# Patient Record
Sex: Female | Born: 1955 | Race: White | Hispanic: No | Smoking: Never smoker
Health system: Southern US, Community
[De-identification: ages and names within clinical notes are randomized; demographics above are authoritative.]

## PROBLEM LIST (undated history)

## (undated) DIAGNOSIS — I10 Essential (primary) hypertension: Secondary | ICD-10-CM

## (undated) DIAGNOSIS — E7800 Pure hypercholesterolemia, unspecified: Secondary | ICD-10-CM

## (undated) DIAGNOSIS — H269 Unspecified cataract: Secondary | ICD-10-CM

## (undated) HISTORY — DX: Essential (primary) hypertension: I10

## (undated) HISTORY — DX: Unspecified cataract: H26.9

## (undated) HISTORY — DX: Pure hypercholesterolemia, unspecified: E78.00

---
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
## 1997-08-14 HISTORY — PX: DILATION AND CURETTAGE OF UTERUS: SHX78

## 1998-02-14 ENCOUNTER — Inpatient Hospital Stay (HOSPITAL_COMMUNITY): Admission: AD | Admit: 1998-02-14 | Discharge: 1998-02-14 | Payer: Self-pay | Admitting: Gynecology

## 1998-02-16 ENCOUNTER — Ambulatory Visit (HOSPITAL_COMMUNITY): Admission: RE | Admit: 1998-02-16 | Discharge: 1998-02-16 | Payer: Self-pay | Admitting: Obstetrics and Gynecology

## 1999-06-01 ENCOUNTER — Other Ambulatory Visit: Admission: RE | Admit: 1999-06-01 | Discharge: 1999-06-01 | Payer: Self-pay | Admitting: Obstetrics and Gynecology

## 2000-07-23 ENCOUNTER — Other Ambulatory Visit: Admission: RE | Admit: 2000-07-23 | Discharge: 2000-07-23 | Payer: Self-pay | Admitting: Gynecology

## 2001-07-31 ENCOUNTER — Other Ambulatory Visit: Admission: RE | Admit: 2001-07-31 | Discharge: 2001-07-31 | Payer: Self-pay | Admitting: Gynecology

## 2002-08-12 ENCOUNTER — Other Ambulatory Visit: Admission: RE | Admit: 2002-08-12 | Discharge: 2002-08-12 | Payer: Self-pay | Admitting: Gynecology

## 2003-08-15 HISTORY — PX: LASIK: SHX215

## 2003-08-27 ENCOUNTER — Other Ambulatory Visit: Admission: RE | Admit: 2003-08-27 | Discharge: 2003-08-27 | Payer: Self-pay | Admitting: Gynecology

## 2005-01-16 ENCOUNTER — Other Ambulatory Visit: Admission: RE | Admit: 2005-01-16 | Discharge: 2005-01-16 | Payer: Self-pay | Admitting: Gynecology

## 2006-03-06 ENCOUNTER — Encounter (INDEPENDENT_AMBULATORY_CARE_PROVIDER_SITE_OTHER): Payer: Self-pay | Admitting: *Deleted

## 2006-03-06 ENCOUNTER — Other Ambulatory Visit: Admission: RE | Admit: 2006-03-06 | Discharge: 2006-03-06 | Payer: Self-pay | Admitting: Gynecology

## 2006-03-06 LAB — CONVERTED CEMR LAB

## 2006-03-14 ENCOUNTER — Ambulatory Visit: Payer: Self-pay | Admitting: Internal Medicine

## 2006-03-19 ENCOUNTER — Ambulatory Visit: Payer: Self-pay | Admitting: Internal Medicine

## 2006-04-05 ENCOUNTER — Ambulatory Visit: Payer: Self-pay | Admitting: Internal Medicine

## 2006-06-11 ENCOUNTER — Ambulatory Visit: Payer: Self-pay | Admitting: Internal Medicine

## 2006-06-11 LAB — CONVERTED CEMR LAB
ALT: 30 units/L (ref 0–40)
AST: 25 units/L (ref 0–37)
HDL: 32.5 mg/dL — ABNORMAL LOW (ref >39.0)
LDL Cholesterol: 79 mg/dL (ref 0–99)
VLDL: 29 mg/dL (ref 0–40)

## 2006-10-10 ENCOUNTER — Ambulatory Visit: Payer: Self-pay | Admitting: Internal Medicine

## 2007-06-05 ENCOUNTER — Other Ambulatory Visit: Admission: RE | Admit: 2007-06-05 | Discharge: 2007-06-05 | Payer: Self-pay | Admitting: Gynecology

## 2007-08-15 HISTORY — PX: COLONOSCOPY: SHX5424

## 2007-11-11 ENCOUNTER — Telehealth (INDEPENDENT_AMBULATORY_CARE_PROVIDER_SITE_OTHER): Payer: Self-pay | Admitting: *Deleted

## 2007-11-14 ENCOUNTER — Encounter (INDEPENDENT_AMBULATORY_CARE_PROVIDER_SITE_OTHER): Payer: Self-pay | Admitting: *Deleted

## 2007-11-14 DIAGNOSIS — Z9089 Acquired absence of other organs: Secondary | ICD-10-CM | POA: Insufficient documentation

## 2007-11-14 DIAGNOSIS — I10 Essential (primary) hypertension: Secondary | ICD-10-CM | POA: Insufficient documentation

## 2007-11-14 DIAGNOSIS — Z9889 Other specified postprocedural states: Secondary | ICD-10-CM | POA: Insufficient documentation

## 2007-11-14 DIAGNOSIS — E785 Hyperlipidemia, unspecified: Secondary | ICD-10-CM | POA: Insufficient documentation

## 2007-11-14 DIAGNOSIS — Z87898 Personal history of other specified conditions: Secondary | ICD-10-CM | POA: Insufficient documentation

## 2007-11-15 ENCOUNTER — Ambulatory Visit: Payer: Self-pay | Admitting: Internal Medicine

## 2007-11-18 ENCOUNTER — Telehealth (INDEPENDENT_AMBULATORY_CARE_PROVIDER_SITE_OTHER): Payer: Self-pay | Admitting: *Deleted

## 2007-11-24 LAB — CONVERTED CEMR LAB
ALT: 21 units/L (ref 0–35)
AST: 19 units/L (ref 0–37)
Albumin: 3.9 g/dL (ref 3.5–5.2)
Alkaline Phosphatase: 37 units/L — ABNORMAL LOW (ref 39–117)
BUN: 13 mg/dL (ref 6–23)
CO2: 28 meq/L (ref 19–32)
Chloride: 107 meq/L (ref 96–112)
Cholesterol: 201 mg/dL (ref 0–200)
Creatinine, Ser: 0.8 mg/dL (ref 0.4–1.2)
Direct LDL: 132.5 mg/dL
Glucose, Bld: 93 mg/dL (ref 70–99)
Potassium: 4.9 meq/L (ref 3.5–5.1)
Total Protein: 6.8 g/dL (ref 6.0–8.3)
VLDL: 32 mg/dL (ref 0–40)

## 2007-11-25 ENCOUNTER — Encounter (INDEPENDENT_AMBULATORY_CARE_PROVIDER_SITE_OTHER): Payer: Self-pay | Admitting: *Deleted

## 2007-11-28 ENCOUNTER — Ambulatory Visit: Payer: Self-pay | Admitting: Internal Medicine

## 2007-11-29 ENCOUNTER — Encounter (INDEPENDENT_AMBULATORY_CARE_PROVIDER_SITE_OTHER): Payer: Self-pay | Admitting: *Deleted

## 2007-12-11 ENCOUNTER — Ambulatory Visit: Payer: Self-pay | Admitting: Gastroenterology

## 2008-01-08 ENCOUNTER — Encounter: Payer: Self-pay | Admitting: Gastroenterology

## 2008-01-08 ENCOUNTER — Ambulatory Visit: Payer: Self-pay | Admitting: Gastroenterology

## 2008-01-10 ENCOUNTER — Encounter: Payer: Self-pay | Admitting: Gastroenterology

## 2008-02-20 ENCOUNTER — Ambulatory Visit: Payer: Self-pay | Admitting: Internal Medicine

## 2008-02-20 LAB — CONVERTED CEMR LAB
Albumin: 3.8 g/dL (ref 3.5–5.2)
Alkaline Phosphatase: 35 units/L — ABNORMAL LOW (ref 39–117)
Bilirubin, Direct: 0.1 mg/dL (ref 0.0–0.3)
HDL: 34.9 mg/dL — ABNORMAL LOW (ref >39.0)
Total CHOL/HDL Ratio: 5.1
VLDL: 47 mg/dL — ABNORMAL HIGH (ref 0–40)

## 2008-02-27 ENCOUNTER — Ambulatory Visit: Payer: Self-pay | Admitting: Internal Medicine

## 2008-02-27 LAB — CONVERTED CEMR LAB
HDL goal, serum: 50 mg/dL
LDL Goal: 100 mg/dL

## 2008-05-27 ENCOUNTER — Ambulatory Visit: Payer: Self-pay | Admitting: Internal Medicine

## 2008-05-27 LAB — CONVERTED CEMR LAB
Cholesterol: 144 mg/dL (ref 0–200)
HDL: 32.5 mg/dL — ABNORMAL LOW (ref >39.0)
Hgb A1c MFr Bld: 5.6 % (ref 4.6–6.0)
VLDL: 37 mg/dL (ref 0–40)

## 2008-06-02 ENCOUNTER — Ambulatory Visit: Payer: Self-pay | Admitting: Internal Medicine

## 2008-06-02 LAB — CONVERTED CEMR LAB
Cholesterol, target level: 200 mg/dL
HDL goal, serum: 50 mg/dL

## 2008-06-08 ENCOUNTER — Ambulatory Visit: Payer: Self-pay | Admitting: Internal Medicine

## 2009-02-23 ENCOUNTER — Encounter: Payer: Self-pay | Admitting: Gynecology

## 2009-02-23 ENCOUNTER — Other Ambulatory Visit: Admission: RE | Admit: 2009-02-23 | Discharge: 2009-02-23 | Payer: Self-pay | Admitting: Gynecology

## 2009-02-23 ENCOUNTER — Ambulatory Visit: Payer: Self-pay | Admitting: Gynecology

## 2009-03-15 ENCOUNTER — Telehealth (INDEPENDENT_AMBULATORY_CARE_PROVIDER_SITE_OTHER): Payer: Self-pay | Admitting: *Deleted

## 2009-05-31 ENCOUNTER — Ambulatory Visit: Payer: Self-pay | Admitting: Internal Medicine

## 2009-06-01 ENCOUNTER — Encounter (INDEPENDENT_AMBULATORY_CARE_PROVIDER_SITE_OTHER): Payer: Self-pay | Admitting: *Deleted

## 2009-06-01 LAB — CONVERTED CEMR LAB
ALT: 23 units/L (ref 0–35)
Albumin: 3.8 g/dL (ref 3.5–5.2)
Alkaline Phosphatase: 36 units/L — ABNORMAL LOW (ref 39–117)
Bilirubin, Direct: 0.1 mg/dL (ref 0.0–0.3)
Cholesterol: 166 mg/dL (ref 0–200)
Total Protein: 7 g/dL (ref 6.0–8.3)
Triglycerides: 166 mg/dL — ABNORMAL HIGH (ref 0.0–149.0)
VLDL: 33.2 mg/dL (ref 0.0–40.0)

## 2009-06-08 ENCOUNTER — Ambulatory Visit: Payer: Self-pay | Admitting: Internal Medicine

## 2010-02-23 ENCOUNTER — Encounter: Payer: Self-pay | Admitting: Internal Medicine

## 2010-09-11 LAB — CONVERTED CEMR LAB: HDL goal, serum: 40 mg/dL

## 2010-09-28 ENCOUNTER — Encounter: Payer: Self-pay | Admitting: Internal Medicine

## 2010-10-11 ENCOUNTER — Telehealth (INDEPENDENT_AMBULATORY_CARE_PROVIDER_SITE_OTHER): Payer: Self-pay | Admitting: *Deleted

## 2010-10-20 NOTE — Progress Notes (Signed)
Summary: need orders and codes for labs=3/19--entered  Phone Note Call from Patient   Summary of Call: has CPX on 11/10/2010----has labs on 3/19----what orders and codes do you want to use please??   thank you Initial call taken by: Jerolyn Shin,  October 11, 2010 2:41 PM  Follow-up for Phone Call        Lipid,Hep,BMP,CBCD,TSH,Stool Cards V70.0/272.4/995.20/401.9 Follow-up by: Shonna Chock CMA,  October 11, 2010 2:44 PM  Additional Follow-up for Phone Call Additional follow up Details #1::        ENTERED LAB INFO.Marland KitchenMarland KitchenJerolyn Shin  October 11, 2010 3:56 PM

## 2010-10-20 NOTE — Progress Notes (Signed)
Summary: Benazepril refill  Phone Note Refill Request Message from:  Patient on October 11, 2010 2:37 PM  Refills Requested: Medication #1:  BENAZEPRIL HCL 20 MG  TABS 1 by mouth once daily**APPOINTMENT DUE** [id number],  [address] [place], [place]  phone [phone], fax [phone]   qty = 30  Next Appointment Scheduled: lab = 3/19,  CPX =3/29  [place] Initial call taken by: Jerolyn Shin,  October 11, 2010 2:38 PM    Prescriptions: BENAZEPRIL HCL 20 MG  TABS (BENAZEPRIL HCL) 1 by mouth once daily**APPOINTMENT DUE**  #30 x 0   Entered by:   Shonna Chock CMA   Authorized by:   Marga Melnick MD   Signed by:   Shonna Chock CMA on 10/11/2010   Method used:   Electronically to        [facility] [phone]* (retail)       [address]       [place], [place]  [address]       Ph: [phone]       Fax: [phone]   RxID:   [id number]

## 2010-10-31 ENCOUNTER — Encounter: Payer: Self-pay | Admitting: *Deleted

## 2010-10-31 ENCOUNTER — Other Ambulatory Visit: Payer: Self-pay | Admitting: Internal Medicine

## 2010-10-31 ENCOUNTER — Other Ambulatory Visit (INDEPENDENT_AMBULATORY_CARE_PROVIDER_SITE_OTHER): Payer: BC Managed Care – PPO

## 2010-10-31 DIAGNOSIS — I10 Essential (primary) hypertension: Secondary | ICD-10-CM

## 2010-10-31 DIAGNOSIS — T887XXA Unspecified adverse effect of drug or medicament, initial encounter: Secondary | ICD-10-CM

## 2010-10-31 DIAGNOSIS — E785 Hyperlipidemia, unspecified: Secondary | ICD-10-CM

## 2010-10-31 LAB — CBC WITH DIFFERENTIAL/PLATELET
Basophils Absolute: 0 10*3/uL (ref 0.0–0.1)
HCT: 39 % (ref 36.0–46.0)
Lymphs Abs: 1.9 10*3/uL (ref 0.7–4.0)
Monocytes Relative: 9.1 % (ref 3.0–12.0)
Platelets: 198 10*3/uL (ref 150.0–400.0)
RDW: 13.7 % (ref 11.5–14.6)

## 2010-10-31 LAB — BASIC METABOLIC PANEL
BUN: 13 mg/dL (ref 6–23)
Calcium: 9 mg/dL (ref 8.4–10.5)
GFR: 84.37 mL/min (ref >60.00)
Glucose, Bld: 99 mg/dL (ref 70–99)
Potassium: 4.3 mEq/L (ref 3.5–5.1)

## 2010-10-31 LAB — HEPATIC FUNCTION PANEL
ALT: 23 U/L (ref 0–35)
AST: 20 U/L (ref 0–37)
Alkaline Phosphatase: 43 U/L (ref 39–117)
Bilirubin, Direct: 0.2 mg/dL (ref 0.0–0.3)
Total Bilirubin: 1.4 mg/dL — ABNORMAL HIGH (ref 0.3–1.2)

## 2010-10-31 LAB — TSH: TSH: 1.71 u[IU]/mL (ref 0.35–5.50)

## 2010-11-10 ENCOUNTER — Encounter: Payer: Self-pay | Admitting: Internal Medicine

## 2010-11-10 ENCOUNTER — Ambulatory Visit (INDEPENDENT_AMBULATORY_CARE_PROVIDER_SITE_OTHER): Payer: BC Managed Care – PPO | Admitting: Internal Medicine

## 2010-11-10 VITALS — BP 106/78 | HR 72 | Temp 98.7°F | Resp 16 | Ht 64.0 in | Wt 181.0 lb

## 2010-11-10 DIAGNOSIS — E785 Hyperlipidemia, unspecified: Secondary | ICD-10-CM

## 2010-11-10 DIAGNOSIS — I10 Essential (primary) hypertension: Secondary | ICD-10-CM

## 2010-11-10 DIAGNOSIS — Z0000 Encounter for general adult medical examination without abnormal findings: Secondary | ICD-10-CM

## 2010-11-10 MED ORDER — SIMVASTATIN 40 MG PO TABS: 40.0000 mg | ORAL_TABLET | Freq: Every day | ORAL | Status: DC

## 2010-11-10 MED ORDER — BENAZEPRIL HCL 20 MG PO TABS: 20.0000 mg | ORAL_TABLET | Freq: Every day | ORAL | Status: DC

## 2010-11-10 NOTE — Progress Notes (Signed)
  Subjective:    Patient ID: Tiffany Vargas, female    DOB: [birth year], 55 y.o.   MRN: [id number]  HPI  She is here for a conference and physical exam; she is asymptomatic.  She does want to discuss her lipid panel.  The past medical history was reviewed and is correct. Striking is a family history of leukemia in her maternal grandfather, mother and maternal uncle. Her [place] from the [place].  She walks approximately 2 miles per week; her exercise program has been cut back.    Review of Systems review of systems was completed in toto  She recently had an upper respiratory tract infection. At this time there are no signs or symptoms of rhinosinusitis.  Blood pressure at home as been well controlled with values less than 120 over the mid 70s. She has no cardiopulmonary symptoms.  She has been compliant with statin and has no adverse symptoms or signs related to this. She is in peri menopausal state; this is based on an [facility] performed by her gynecologist.      Objective:   Physical Exam she is healthy and well-nourished.  No scleral icterus is present; pupils were equal round and reactive to light  Nares are patent without exudate or erythema.  Oropharynx reveals no active inflammation.  Tympanic membranes are normal.  Thyroid is normal to palpation without nodules.  Chest is clear with no rales, rhonchi, or wheezes.  She exhibits an S4 with slight slurring at the base without significant murmurs, gallop, or rub. All pulses are intact and no  Bruits are  noted.  Bowel sounds are normal; there is no tenderness, organomegaly, or masses  Breasts pelvic and rectal exams are deferred to her gynecologist  Range of motion is normal; there are no significant arthritic changes in the extremities.  She is oriented X 3; mood and affect are normal.          Assessment & Plan:  #1 Comprehensive physical without significant  physical findings or issues.  #2  hypertension well controlled  #3 dyslipidemia, on statins.  Plan: No change will be made in medications; avoidance of high fructose corn syrup sugar as discussed. Lipids should be  monitored annually.

## 2010-11-10 NOTE — Patient Instructions (Signed)
Once established as you are postmenopausal, please have a bone mineral density performed.   The most common cause of elevated triglycerides is the ingestion of sugar from high fructose corn syrup sources. You should consume less than 40 grams  of sugar per day from foods and drinks with high fructose corn syrup as number 2, 3, or #4 on the label.

## 2010-11-29 ENCOUNTER — Encounter: Payer: Self-pay | Admitting: Internal Medicine

## 2010-12-30 NOTE — Assessment & Plan Note (Signed)
[facility]                        [place] OFFICE NOTE   NAME:REIDZoye, Chandra                          MRN:          [id number]  DATE:10/10/2006                            DOB:          1956/04/10    HISTORY OF PRESENT ILLNESS:  Tiffany Vargas was seen October 10, 2006, age  55, complaining of pain in the left upper extremity.  She has had pain  in the biceps area for several weeks, between the shoulder and the  elbow.  It is throbbing & lasts hours.  It is not exertional and  typically occurs at rest.  She has not been able to impact the pain with  any position change.  Occasionally, she will have tingling of the entire  arm or of just the hand.  There is no history of neck injury.   PAST MEDICAL HISTORY:  1. C-section 1995.  2. D&C 1999.  3. Tonsillectomy 1964.  4. Two pregnancies and one delivery.  5. Lasix surgery.   MEDICATIONS:  1. Benazepril 20 mg daily for hypertension.  2. History of migraines, but is on no regular medications.  3. She had been on Vytorin 10/20, but has stopped this several months      ago.  She did not relate the Statin therapy to the arm symptoms.   PHYSICAL EXAMINATION:  VITAL SIGNS:  Weight was essentially stable,  pulse 60 and regular, respiratory rate 14 and unlabored, blood pressure  118/88.   No lymphadenopathy in neck, head or axilla.  No skin lesions were noted  to suggest zoster.  She did have pain with passive rotation of the  shoulder and also with reaching behind her thorax.  Strength and deep  tendon reflexes were normal.   A shoulder impingement syndrome is suggested which may also have some  radicular component.  I could not appreciate any evidence clinically of  cervical disk disease.  Orthopedic consultation will be pursued.   Her NMR from March 19, 2006, was reviewed.  At that time, her LDL was  170 with 2983 total particles and 2504 small dense particles.  These  values will be associated  with at least a 20% Cardiovascular risk over  the next decade.  On the Vytorin, her LDL has dropped to 79 and her HDL  had climbed up from 26 to 33.  Her LDL goal would be less than 100 with  an ideal of less than 75.   Options were discussed, and she has elected to resume the Vytorin with  recheck of labs in six months.  If the values are unchanged, I would  recommend checking these annually.   Orthopedic referral will be pursued for the shoulder impingement  symptoms.  MRI or other imaging will be deferred to the orthopedist.     Titus Dubin. Alwyn Ren, MD,FACP,FCCP  Electronically Signed    [facility]  DD: 10/11/2006  DT: 10/11/2006  Job #: [id number]

## 2011-03-09 ENCOUNTER — Encounter: Payer: Self-pay | Admitting: Gynecology

## 2011-10-24 ENCOUNTER — Other Ambulatory Visit: Payer: Self-pay | Admitting: Internal Medicine

## 2011-10-24 NOTE — Telephone Encounter (Signed)
Prescription sent to pharmacy, no refills.  Patient is due for physical.

## 2012-02-08 ENCOUNTER — Other Ambulatory Visit: Payer: Self-pay | Admitting: Internal Medicine

## 2012-03-13 ENCOUNTER — Encounter: Payer: Self-pay | Admitting: Gynecology

## 2012-03-13 DIAGNOSIS — E7800 Pure hypercholesterolemia, unspecified: Secondary | ICD-10-CM | POA: Insufficient documentation

## 2012-03-13 DIAGNOSIS — I10 Essential (primary) hypertension: Secondary | ICD-10-CM | POA: Insufficient documentation

## 2012-03-20 ENCOUNTER — Encounter: Payer: Self-pay | Admitting: Gynecology

## 2012-03-20 ENCOUNTER — Other Ambulatory Visit (HOSPITAL_COMMUNITY)
Admission: RE | Admit: 2012-03-20 | Discharge: 2012-03-20 | Disposition: A | Discharge status: Home / Self Care | Payer: BC Managed Care – PPO | Source: Ambulatory Visit | Attending: Gynecology | Admitting: Gynecology

## 2012-03-20 ENCOUNTER — Ambulatory Visit (INDEPENDENT_AMBULATORY_CARE_PROVIDER_SITE_OTHER): Payer: BC Managed Care – PPO | Admitting: Gynecology

## 2012-03-20 VITALS — BP 120/72 | Ht 63.5 in | Wt 189.0 lb

## 2012-03-20 DIAGNOSIS — N926 Irregular menstruation, unspecified: Secondary | ICD-10-CM

## 2012-03-20 DIAGNOSIS — Z01419 Encounter for gynecological examination (general) (routine) without abnormal findings: Secondary | ICD-10-CM

## 2012-03-20 DIAGNOSIS — Z1151 Encounter for screening for human papillomavirus (HPV): Secondary | ICD-10-CM | POA: Insufficient documentation

## 2012-03-20 NOTE — Progress Notes (Signed)
ABILENE MCPHEE 07/28/1956 [id number]        56 y.o.  G2P1011 for annual exam.  Several issues noted below.  Past medical history,surgical history, medications, allergies, family history and social history were all reviewed and documented in the EPIC chart. ROS:  Was performed and pertinent positives and negatives are included in the history.  Exam: Amy assistant Filed Vitals:   03/20/12 1532  BP: 120/72  Height: 5' 3.5" (1.613 m)  Weight: 189 lb (85.73 kg)   General appearance  Normal Skin grossly normal Head/Neck normal with no cervical or supraclavicular adenopathy thyroid normal Lungs  clear Cardiac RR, without RMG Abdominal  soft, nontender, without masses, organomegaly or hernia Breasts  examined lying and sitting without masses, retractions, discharge or axillary adenopathy. Pelvic  Ext/BUS/vagina  normal   Cervix  normal Pap/HPV  Uterus  anteverted, normal size, shape and contour, midline and mobile nontender   Adnexa  Without masses or tenderness    Anus and perineum  normal   Rectovaginal  normal sphincter tone without palpated masses or tenderness.    Assessment/Plan:  56 y.o. G84P1011 female for annual exam.   1. Menstrual regularity. Patients menses have become more irregular this past year we she will skip several months and then have a regular menses. She did have transient hot flushes night sweats earlier but now is doing well. She did have an FSH of 28 several years ago. Reviewed menopausal changes with her. We'll keep menstrual calendar as long as less frequent but regular flow when it does occur then we'll monitor. If she has prolonged or atypical bleeding she'll represent. Discussed treatment options for symptoms. If she develops hot flushes night sweats will try OTC soy based first. I discussed possible HRT in the [facility] study with risks of stroke heart attack DVT and breast cancer reviewed. Transdermal/oral first pass effect issues reviewed. Patient will call if hot  flashes become intolerable but otherwise will monitor. 2. Mammography.  Patient to now knows to schedule. SBE monthly reviewed. 3. Pap smear. Last Pap smear 02/2009.  Pap/HPV done today. No history of abnormal Pap smears before. If this Pap normal them plan every 5 year screening. 4. Colonoscopy. Colonoscopy 2009 with plan to repeat a 10 year interval. She reports Dr. Alwyn Ren doing guaiac screening. 5. Bone health. We'll plan DEXA further into menopause. Increase calcium vitamin D reviewed. 6. Health maintenance. No blood work was done today this is all done through Dr. Frederik Pear office. Follow up 1 year or sooner if issues.    Dara Lords MD, 4:49 PM 03/20/2012

## 2012-03-20 NOTE — Patient Instructions (Signed)
Keep menstrual calendar. As long as menses are regular or less frequent but regular flows them we'll monitor. If prolonged bleeding or significant atypical bleeding represent for evaluation. Follow up in one year for annual gynecologic exam.

## 2012-03-27 DIAGNOSIS — H269 Unspecified cataract: Secondary | ICD-10-CM | POA: Insufficient documentation

## 2012-06-01 ENCOUNTER — Other Ambulatory Visit: Payer: Self-pay | Admitting: Internal Medicine

## 2012-06-03 NOTE — Telephone Encounter (Signed)
Pending appt 08/2012

## 2012-06-12 DIAGNOSIS — J309 Allergic rhinitis, unspecified: Secondary | ICD-10-CM | POA: Insufficient documentation

## 2012-06-20 HISTORY — PX: CATARACT EXTRACTION: SUR2

## 2012-08-23 ENCOUNTER — Ambulatory Visit (INDEPENDENT_AMBULATORY_CARE_PROVIDER_SITE_OTHER): Payer: BC Managed Care – PPO | Admitting: Internal Medicine

## 2012-08-23 ENCOUNTER — Encounter: Payer: Self-pay | Admitting: Internal Medicine

## 2012-08-23 VITALS — BP 122/80 | HR 95 | Temp 98.8°F | Resp 12 | Ht 63.08 in | Wt 179.0 lb

## 2012-08-23 DIAGNOSIS — I10 Essential (primary) hypertension: Secondary | ICD-10-CM

## 2012-08-23 DIAGNOSIS — Z0000 Encounter for general adult medical examination without abnormal findings: Secondary | ICD-10-CM

## 2012-08-23 DIAGNOSIS — E785 Hyperlipidemia, unspecified: Secondary | ICD-10-CM

## 2012-08-23 MED ORDER — SIMVASTATIN 40 MG PO TABS: 40.0000 mg | ORAL_TABLET | Freq: Every day | ORAL | Status: DC

## 2012-08-23 MED ORDER — BENAZEPRIL HCL 20 MG PO TABS: 20.0000 mg | ORAL_TABLET | Freq: Every day | ORAL | Status: DC

## 2012-08-23 NOTE — Patient Instructions (Addendum)
Preventive Health Care: Exercise  30-45  minutes a day, 3-4 days a week. Walking is especially valuable in preventing Osteoporosis. Eat a low-fat diet with lots of fruits and vegetables, up to 7-9 servings per day.  Consume less than 30 grams of sugar per day from foods & drinks with High Fructose Corn Syrup as #1,2,3 or #4 on label. Blood Pressure Goal  Ideally is an AVERAGE < 135/85. This AVERAGE should be calculated from @ least 5-7 BP readings taken @ different times of day on different days of week. You should not respond to isolated BP readings , but rather the AVERAGE for that week  To prevent palpitations or premature beats, avoid stimulants such as decongestants, diet pills, nicotine, or caffeine (coffee, tea, cola, or chocolate) to excess.   Please  schedule fasting Labs : BMET,Lipids, hepatic panel, CBC & dif, TSH. PLEASE BRING THESE INSTRUCTIONS TO FOLLOW UP  LAB APPOINTMENT.This will guarantee correct labs are drawn, eliminating need for repeat blood sampling ( needle sticks ! ). Diagnoses /Codes: V70.0   If you activate My Chart; the results can be released to you as soon as they populate from the lab. If you choose not to use this program; the labs have to be reviewed, copied & mailed causing a delay in getting the results to you.

## 2012-08-23 NOTE — Progress Notes (Signed)
  Subjective:    Patient ID: Tiffany Vargas, female    DOB: September 10, 1955, 57 y.o.   MRN: [id number]  HPI  Mrs Stehlin is here for a physical; he denies acute issues .      Review of Systems She is on a modified heart healthy diet; she exercises 60 minutes per week without symptoms. Specifically she denies chest pain, palpitations, dyspnea, or claudication. Family history is negative for premature coronary disease. Advanced cholesterol testing reveals his LDL goal was less than 100, ideally < 70      Objective:   Physical Exam Gen.:  well-nourished in appearance. Alert, appropriate and cooperative throughout exam. Head: Normocephalic without obvious abnormalities Eyes: No corneal or conjunctival inflammation noted. Pupils equal round reactive to light and accommodation. Fundal exam is benign without hemorrhages, exudate, papilledema. Extraocular motion intact. Vision grossly normal. Ears: External  ear exam reveals no significant lesions or deformities. Canals clear .TMs normal. Hearing is grossly normal bilaterally. Nose: External nasal exam reveals no deformity or inflammation. Nasal mucosa are pink and moist. No lesions or exudates noted.  Mouth: Oral mucosa and oropharynx reveal no lesions or exudates. Teeth in good repair. Neck: No deformities, masses, or tenderness noted. Range of motion & Thyroid normal. Lungs: Normal respiratory effort; chest expands symmetrically. Lungs are clear to auscultation without rales, wheezes, or increased work of breathing. Heart: Normal rate and rhythm. Normal S1 and S2. No gallop, click, or rub. S4 w/o murmur. Abdomen: Bowel sounds normal; abdomen soft and nontender. No masses, organomegaly or hernias noted. Genitalia: Dr Audie Box, Gyn Musculoskeletal/extremities: No deformity or scoliosis noted of  the thoracic or lumbar spine. No clubbing, cyanosis, edema, or deformity noted. Range of motion  normal .Tone & strength  normal.Joints normal. Nail health   good. Vascular: Carotid, radial artery, dorsalis pedis and  posterior tibial pulses are full and equal. No bruits present. Neurologic: Alert and oriented x3. Deep tendon reflexes symmetrical and normal.          Skin: Intact without suspicious lesions or rashes. Lymph: No cervical, axillary lymphadenopathy present. Psych: Mood and affect are normal. Normally interactive                                                                                        Assessment & Plan:  #1 comprehensive physical exam; no acute findings  Plan: see Orders

## 2012-08-27 ENCOUNTER — Other Ambulatory Visit (INDEPENDENT_AMBULATORY_CARE_PROVIDER_SITE_OTHER): Payer: BC Managed Care – PPO

## 2012-08-27 DIAGNOSIS — Z0000 Encounter for general adult medical examination without abnormal findings: Secondary | ICD-10-CM

## 2012-08-27 LAB — HEPATIC FUNCTION PANEL
AST: 26 U/L (ref 0–37)
Alkaline Phosphatase: 44 U/L (ref 39–117)
Bilirubin, Direct: 0.2 mg/dL (ref 0.0–0.3)
Total Bilirubin: 1.7 mg/dL — ABNORMAL HIGH (ref 0.3–1.2)

## 2012-08-27 LAB — BASIC METABOLIC PANEL
CO2: 27 mEq/L (ref 19–32)
Calcium: 9.2 mg/dL (ref 8.4–10.5)
Creatinine, Ser: 0.8 mg/dL (ref 0.4–1.2)
Glucose, Bld: 92 mg/dL (ref 70–99)

## 2012-08-27 LAB — LIPID PANEL: Total CHOL/HDL Ratio: 3

## 2012-08-27 LAB — CBC WITH DIFFERENTIAL/PLATELET
Basophils Absolute: 0 10*3/uL (ref 0.0–0.1)
HCT: 42.5 % (ref 36.0–46.0)
Lymphs Abs: 2.1 10*3/uL (ref 0.7–4.0)
MCV: 86.8 fl (ref 78.0–100.0)
Monocytes Absolute: 0.6 10*3/uL (ref 0.1–1.0)
Platelets: 230 10*3/uL (ref 150.0–400.0)
RDW: 13.4 % (ref 11.5–14.6)

## 2012-08-27 LAB — TSH: TSH: 1.22 u[IU]/mL (ref 0.35–5.50)

## 2013-03-24 ENCOUNTER — Ambulatory Visit (INDEPENDENT_AMBULATORY_CARE_PROVIDER_SITE_OTHER): Payer: BC Managed Care – PPO | Admitting: Gynecology

## 2013-03-24 ENCOUNTER — Encounter: Payer: Self-pay | Admitting: Gynecology

## 2013-03-24 VITALS — BP 134/84 | Ht 63.0 in | Wt 176.0 lb

## 2013-03-24 DIAGNOSIS — Z01419 Encounter for gynecological examination (general) (routine) without abnormal findings: Secondary | ICD-10-CM

## 2013-03-24 NOTE — Patient Instructions (Signed)
Follow up in one year, sooner if any issues 

## 2013-03-24 NOTE — Progress Notes (Signed)
Tiffany Vargas [birth year] [id number]        57 y.o.  G2P1011 for annual exam.  Several issues noted below.  Past medical history,surgical history, medications, allergies, family history and social history were all reviewed and documented in the EPIC chart.  ROS:  Performed and pertinent positives and negatives are included in the history, assessment and plan .  Exam: Kim assistant Filed Vitals:   03/24/13 1554  BP: 134/84  Height: 5\' 3"  (1.6 m)  Weight: 176 lb (79.833 kg)   General appearance  Normal Skin grossly normal Head/Neck normal with no cervical or supraclavicular adenopathy thyroid normal Lungs  clear Cardiac RR, without RMG Abdominal  soft, nontender, without masses, organomegaly or hernia Breasts  examined lying and sitting without masses, retractions, discharge or axillary adenopathy. Pelvic  Ext/BUS/vagina  normal   Cervix  normal   Uterus  anteverted, normal size, shape and contour, midline and mobile nontender   Adnexa  Without masses or tenderness    Anus and perineum  normal   Rectovaginal  normal sphincter tone without palpated masses or tenderness.    Assessment/Plan:  57 y.o. G86P1011 female for annual exam, regular menses, contraception not an issue.   1. Continues with regular menses. No menopausal symptoms. We'll continue to monitor. Contraception is not an issue. If significant irregularity or menopausal symptoms develop then she'll represent for evaluation and treatment options. 2. Pap smear/HPV 2013 normal. No Pap smear done today. Plan repeat at 3-5 your interval. No history of abnormal Pap smears previously. 3. Mammography scheduled now. She asked about 3-D. I discussed possible better detection rate and decreased recall rate, possibly better in dense breasts. Patient will decide if she wants. SBE monthly reviewed. 4. Colonoscopy 2009. Repeat at their recommended interval. 5. DEXA and number of years ago. No significant risk factors. Still menstruating.  Will plan baseline further into menopause. Increase calcium vitamin D reviewed. 6. Health maintenance. No lab work done as are all done through Dr. Caryl Never office. Followup one year, sooner if any issues.  Note: This document was prepared with digital dictation and possible smart phrase technology. Any transcriptional errors that result from this process are unintentional.   Dara Lords MD, 4:49 PM 03/24/2013

## 2013-04-03 ENCOUNTER — Encounter: Payer: Self-pay | Admitting: Gynecology

## 2013-07-01 ENCOUNTER — Other Ambulatory Visit: Payer: Self-pay | Admitting: *Deleted

## 2013-07-01 MED ORDER — OSELTAMIVIR PHOSPHATE 75 MG PO CAPS: 75.0000 mg | ORAL_CAPSULE | Freq: Every day | ORAL | Status: DC

## 2013-08-25 ENCOUNTER — Other Ambulatory Visit: Payer: Self-pay | Admitting: Internal Medicine

## 2013-08-25 NOTE — Telephone Encounter (Signed)
Benazepril and Simvastatin refilled per protocol. JG//CMA

## 2013-12-17 ENCOUNTER — Other Ambulatory Visit: Payer: Self-pay | Admitting: *Deleted

## 2013-12-17 MED ORDER — SIMVASTATIN 40 MG PO TABS: ORAL_TABLET | ORAL | Status: DC

## 2014-01-26 ENCOUNTER — Encounter (HOSPITAL_BASED_OUTPATIENT_CLINIC_OR_DEPARTMENT_OTHER): Payer: Self-pay | Admitting: *Deleted

## 2014-01-26 NOTE — Progress Notes (Signed)
To come in for ekg-bmet 

## 2014-01-27 ENCOUNTER — Other Ambulatory Visit: Payer: Self-pay

## 2014-01-27 ENCOUNTER — Other Ambulatory Visit: Payer: Self-pay | Admitting: Orthopedic Surgery

## 2014-01-27 ENCOUNTER — Encounter (HOSPITAL_BASED_OUTPATIENT_CLINIC_OR_DEPARTMENT_OTHER)
Admission: RE | Admit: 2014-01-27 | Discharge: 2014-01-27 | Disposition: A | Discharge status: Home / Self Care | Payer: Worker's Compensation | Source: Ambulatory Visit | Attending: Orthopedic Surgery | Admitting: Orthopedic Surgery

## 2014-01-27 DIAGNOSIS — Z01810 Encounter for preprocedural cardiovascular examination: Secondary | ICD-10-CM | POA: Insufficient documentation

## 2014-01-27 LAB — BASIC METABOLIC PANEL
BUN: 14 mg/dL (ref 6–23)
CHLORIDE: 99 meq/L (ref 96–112)
CO2: 24 mEq/L (ref 19–32)
CREATININE: 0.74 mg/dL (ref 0.50–1.10)
Calcium: 9.6 mg/dL (ref 8.4–10.5)
GFR calc Af Amer: >90 mL/min (ref >90)
GFR calc non Af Amer: >90 mL/min (ref >90)
Glucose, Bld: 101 mg/dL — ABNORMAL HIGH (ref 70–99)
Potassium: 4.4 mEq/L (ref 3.7–5.3)
Sodium: 139 mEq/L (ref 137–147)

## 2014-01-28 NOTE — H&P (Signed)
Tiffany Vargas is an 58 y.o. female.   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  She was evaluated by the company physician and placed on ibuprofen and advised to ice her wrist.  She was given a Dosepak in February and a wrist splint.  This did not relieve her symptoms. She has been struggling with pain on the radial aspect of her wrist, difficulty with radial and ulnar deviation of the wrist and pain with certain lifting motions since mid-February.  She now seeks an upper extremity orthopaedic consult.     Past Medical History  Diagnosis Date  . Hypertension   . Elevated cholesterol   . Cataract     OS    Past Surgical History  Procedure Laterality Date  . Cesarean section  1995  . Lasik  08/2003  . Dilation and curettage of uterus  1999    Miscarriage  . Tonsillectomy and adenoidectomy  1964  . Colonoscopy  2009    Negative  . Cataract extraction  06/20/2012    right eye, lens implant , Dr Isaiah Blakes , [facility]    Family History  Problem Relation Age of Onset  . Breast cancer Maternal Grandmother 35  . Hypertension Mother   . Leukemia Mother   . Diabetes Mother   . Heart failure Father     CHF  . Cataracts Father   . Cataracts Sister   . Cataracts Sister   . Leukemia Maternal Uncle   . Leukemia Maternal Grandfather   . Stroke Neg Hx    Social History:  reports that she has never smoked. She has never used smokeless tobacco. She reports that she drinks alcohol. She reports that she does not use illicit drugs.  Allergies:  Allergies  Allergen Reactions  . Hydrocodone Other (See Comments)    Disoriented    No prescriptions prior to admission    Results for orders placed during the hospital encounter of 01/29/14 (from the past 48 hour(s))  BASIC METABOLIC PANEL     Status: Abnormal   Collection Time    01/27/14 12:30 PM      Result Value Ref Range   Sodium 139  137 - 147 mEq/L   Potassium 4.4  3.7 - 5.3 mEq/L   Chloride 99  96 - 112 mEq/L   CO2 24  19 - 32 mEq/L   Glucose, Bld 101 (*) 70 - 99 mg/dL   BUN 14  6 - 23 mg/dL   Creatinine, Ser 0.74  0.50 - 1.10 mg/dL   Calcium 9.6  8.4 - 10.5 mg/dL   GFR calc non Af Amer >90  >90 mL/min   GFR calc Af Amer >90  >90 mL/min   Comment: (NOTE)     The eGFR has been calculated using the CKD EPI equation.     This calculation has not been validated in all clinical situations.     eGFR's persistently <90 mL/min signify possible Chronic Kidney     Disease.    No results found.   Pertinent items are noted in HPI.  Height _0  (1.6 m), weight 79.379 kg (175 lb), last menstrual period 03/06/2013.  General appearance: alert Head: Normocephalic, without obvious abnormality Neck: supple, symmetrical, trachea midline Resp: clear to auscultation bilaterally Cardio: regular rate and rhythm GI: normal findings: bowel  sounds normal Extremities: .  Inspection of her upper extremities reveals swelling over her right first dorsal compartment.  She also is mildly tender on palpation of the intersection between the radial wrist extensors and the outcropping muscles.  She has a markedly positive Finkelstein maneuver.  Pulse and cap refill are intact.  She has no sign of entrapment neuropathy.  There is no sign of stenosing tenosynovitis of her fingers or thumb.   RADIOGRAPHS:   X-rays of her wrist, AP, lateral and oblique demonstrate normal bony anatomy.   Pulses: 2+ and symmetric Skin: normal Neurologic: Grossly normal    Assessment/Plan Impression: Right wrist DeQuervain's   Plan: To the OR for release right first dorsal compartment.The procedure, risks,benefits and post-op course were discussed with the patient at length and they were in agreement with the plan.  DASNOIT,Sommer Spickard J 01/28/2014, 4:00 PM   H&P documentation:  01/29/2014  -History and Physical Reviewed  -Patient has been re-examined  -No change in the plan of care  Cammie Sickle, MD

## 2014-01-29 ENCOUNTER — Ambulatory Visit (HOSPITAL_BASED_OUTPATIENT_CLINIC_OR_DEPARTMENT_OTHER)
Admission: RE | Admit: 2014-01-29 | Discharge: 2014-01-29 | Disposition: A | Discharge status: Home / Self Care | Payer: Worker's Compensation | Source: Ambulatory Visit | Attending: Orthopedic Surgery | Admitting: Orthopedic Surgery

## 2014-01-29 ENCOUNTER — Ambulatory Visit (HOSPITAL_BASED_OUTPATIENT_CLINIC_OR_DEPARTMENT_OTHER): Payer: Worker's Compensation | Admitting: Anesthesiology

## 2014-01-29 ENCOUNTER — Encounter (HOSPITAL_BASED_OUTPATIENT_CLINIC_OR_DEPARTMENT_OTHER): Payer: Worker's Compensation | Admitting: Anesthesiology

## 2014-01-29 ENCOUNTER — Encounter (HOSPITAL_BASED_OUTPATIENT_CLINIC_OR_DEPARTMENT_OTHER): Payer: Self-pay | Admitting: *Deleted

## 2014-01-29 ENCOUNTER — Encounter (HOSPITAL_BASED_OUTPATIENT_CLINIC_OR_DEPARTMENT_OTHER)
Admission: RE | Disposition: A | Discharge status: Home / Self Care | Payer: Self-pay | Source: Ambulatory Visit | Attending: Orthopedic Surgery

## 2014-01-29 DIAGNOSIS — E7800 Pure hypercholesterolemia, unspecified: Secondary | ICD-10-CM | POA: Insufficient documentation

## 2014-01-29 DIAGNOSIS — M654 Radial styloid tenosynovitis [de Quervain]: Secondary | ICD-10-CM | POA: Insufficient documentation

## 2014-01-29 DIAGNOSIS — I10 Essential (primary) hypertension: Secondary | ICD-10-CM | POA: Insufficient documentation

## 2014-01-29 HISTORY — PX: DORSAL COMPARTMENT RELEASE: SHX5039

## 2014-01-29 LAB — POCT HEMOGLOBIN-HEMACUE: HEMOGLOBIN: 14.1 g/dL (ref 12.0–15.0)

## 2014-01-29 SURGERY — RELEASE, FIRST DORSAL COMPARTMENT, HAND
Anesthesia: Monitor Anesthesia Care | Laterality: Right

## 2014-01-29 MED ORDER — PROPOFOL INFUSION 10 MG/ML OPTIME
INTRAVENOUS | Status: DC | PRN
  Administered 2014-01-29: 100 ug/kg/min via INTRAVENOUS

## 2014-01-29 MED ORDER — LACTATED RINGERS IV SOLN
INTRAVENOUS | Status: DC
  Administered 2014-01-29: 08:00:00 via INTRAVENOUS

## 2014-01-29 MED ORDER — OXYCODONE HCL 5 MG/5ML PO SOLN: 5.0000 mg | Freq: Once | ORAL | Status: DC | PRN

## 2014-01-29 MED ORDER — CHLORHEXIDINE GLUCONATE 4 % EX LIQD: 60.0000 mL | Freq: Once | CUTANEOUS | Status: DC

## 2014-01-29 MED ORDER — LIDOCAINE HCL (CARDIAC) 20 MG/ML IV SOLN
INTRAVENOUS | Status: DC | PRN
  Administered 2014-01-29: 50 mg via INTRAVENOUS

## 2014-01-29 MED ORDER — ONDANSETRON HCL 4 MG/2ML IJ SOLN: 4.0000 mg | Freq: Once | INTRAMUSCULAR | Status: DC | PRN

## 2014-01-29 MED ORDER — FENTANYL CITRATE 0.05 MG/ML IJ SOLN
INTRAMUSCULAR | Status: AC
  Filled 2014-01-29: qty 2

## 2014-01-29 MED ORDER — MIDAZOLAM HCL 5 MG/5ML IJ SOLN
INTRAMUSCULAR | Status: DC | PRN
  Administered 2014-01-29: 2 mg via INTRAVENOUS

## 2014-01-29 MED ORDER — MIDAZOLAM HCL 2 MG/2ML IJ SOLN: 1.0000 mg | INTRAMUSCULAR | Status: DC | PRN

## 2014-01-29 MED ORDER — OXYCODONE HCL 5 MG PO TABS: 5.0000 mg | ORAL_TABLET | Freq: Once | ORAL | Status: DC | PRN

## 2014-01-29 MED ORDER — LIDOCAINE HCL 2 % IJ SOLN
INTRAMUSCULAR | Status: DC | PRN
  Administered 2014-01-29: 3.5 mL

## 2014-01-29 MED ORDER — ONDANSETRON HCL 4 MG/2ML IJ SOLN
INTRAMUSCULAR | Status: DC | PRN
  Administered 2014-01-29: 4 mg via INTRAVENOUS

## 2014-01-29 MED ORDER — FENTANYL CITRATE 0.05 MG/ML IJ SOLN
INTRAMUSCULAR | Status: DC | PRN
  Administered 2014-01-29: 50 ug via INTRAVENOUS

## 2014-01-29 MED ORDER — MEPERIDINE HCL 25 MG/ML IJ SOLN: 6.2500 mg | INTRAMUSCULAR | Status: DC | PRN

## 2014-01-29 MED ORDER — MIDAZOLAM HCL 2 MG/2ML IJ SOLN
INTRAMUSCULAR | Status: AC
  Filled 2014-01-29: qty 2

## 2014-01-29 MED ORDER — FENTANYL CITRATE 0.05 MG/ML IJ SOLN: 50.0000 ug | INTRAMUSCULAR | Status: DC | PRN

## 2014-01-29 MED ORDER — ACETAMINOPHEN-CODEINE #3 300-30 MG PO TABS: 1.0000 | ORAL_TABLET | ORAL | Status: DC | PRN

## 2014-01-29 MED ORDER — HYDROMORPHONE HCL PF 1 MG/ML IJ SOLN: 0.2500 mg | INTRAMUSCULAR | Status: DC | PRN

## 2014-01-29 SURGICAL SUPPLY — 42 items
BANDAGE COBAN STERILE 2 (GAUZE/BANDAGES/DRESSINGS) IMPLANT
BANDAGE ELASTIC 3 VELCRO ST LF (GAUZE/BANDAGES/DRESSINGS) ×1 IMPLANT
BLADE MINI RND TIP GREEN BEAV (BLADE) IMPLANT
BLADE SURG 15 STRL LF DISP TIS (BLADE) ×1 IMPLANT
BLADE SURG 15 STRL SS (BLADE) ×2
BNDG CMPR 9X4 STRL LF SNTH (GAUZE/BANDAGES/DRESSINGS) ×1
BNDG CMPR MD 5X2 ELC HKLP STRL (GAUZE/BANDAGES/DRESSINGS) ×1
BNDG ELASTIC 2 VLCR STRL LF (GAUZE/BANDAGES/DRESSINGS) ×1 IMPLANT
BNDG ESMARK 4X9 LF (GAUZE/BANDAGES/DRESSINGS) ×1 IMPLANT
BRUSH SCRUB EZ PLAIN DRY (MISCELLANEOUS) ×2 IMPLANT
CORDS BIPOLAR (ELECTRODE) ×1 IMPLANT
COVER MAYO STAND STRL (DRAPES) ×2 IMPLANT
COVER TABLE BACK 60X90 (DRAPES) ×2 IMPLANT
CUFF TOURNIQUET SINGLE 18IN (TOURNIQUET CUFF) ×1 IMPLANT
DECANTER SPIKE VIAL GLASS SM (MISCELLANEOUS) ×1 IMPLANT
DRAPE EXTREMITY T 121X128X90 (DRAPE) ×2 IMPLANT
DRAPE SURG 17X23 STRL (DRAPES) ×2 IMPLANT
DRSG TEGADERM 4X4.75 (GAUZE/BANDAGES/DRESSINGS) IMPLANT
GAUZE SPONGE 4X4 12PLY STRL (GAUZE/BANDAGES/DRESSINGS) ×2 IMPLANT
GLOVE BIOGEL M STRL SZ7.5 (GLOVE) ×1 IMPLANT
GLOVE ORTHO TXT STRL SZ7.5 (GLOVE) ×2 IMPLANT
GOWN STRL REUS W/ TWL LRG LVL3 (GOWN DISPOSABLE) ×1 IMPLANT
GOWN STRL REUS W/ TWL XL LVL3 (GOWN DISPOSABLE) ×2 IMPLANT
GOWN STRL REUS W/TWL LRG LVL3 (GOWN DISPOSABLE) ×2
GOWN STRL REUS W/TWL XL LVL3 (GOWN DISPOSABLE) ×2
NEEDLE 27GAX1X1/2 (NEEDLE) ×1 IMPLANT
PACK BASIN DAY SURGERY FS (CUSTOM PROCEDURE TRAY) ×2 IMPLANT
PAD CAST 3X4 CTTN HI CHSV (CAST SUPPLIES) IMPLANT
PADDING CAST ABS 4INX4YD NS (CAST SUPPLIES)
PADDING CAST ABS COTTON 4X4 ST (CAST SUPPLIES) ×1 IMPLANT
PADDING CAST COTTON 3X4 STRL (CAST SUPPLIES)
SLEEVE SCD COMPRESS KNEE MED (MISCELLANEOUS) IMPLANT
STOCKINETTE 4X48 STRL (DRAPES) ×2 IMPLANT
STRIP CLOSURE SKIN 1/2X4 (GAUZE/BANDAGES/DRESSINGS) ×2 IMPLANT
SUT PROLENE 3 0 PS 2 (SUTURE) ×2 IMPLANT
SUT PROLENE 4 0 P 3 18 (SUTURE) IMPLANT
SUT VIC AB 4-0 P-3 18XBRD (SUTURE) IMPLANT
SUT VIC AB 4-0 P3 18 (SUTURE)
SYR 3ML 23GX1 SAFETY (SYRINGE) IMPLANT
SYR CONTROL 10ML LL (SYRINGE) ×1 IMPLANT
TRAY DSU PREP LF (CUSTOM PROCEDURE TRAY) ×2 IMPLANT
UNDERPAD 30X30 INCONTINENT (UNDERPADS AND DIAPERS) ×2 IMPLANT

## 2014-01-29 NOTE — Op Note (Signed)
[id number] 

## 2014-01-29 NOTE — Anesthesia Postprocedure Evaluation (Signed)
Anesthesia Post Note  Patient: Tiffany Vargas  Procedure(s) Performed: Procedure(s) (LRB): RIGHT  FIRST DORSAL COMPARTMENT RELEASE (DEQUERVAIN) (Right)  Anesthesia type: general  Patient location: PACU  Post pain: Pain level controlled  Post assessment: Patient's Cardiovascular Status Stable  Last Vitals:  Filed Vitals:   01/29/14 1001  BP: 118/71  Pulse: 57  Temp: 36.8 C  Resp: 16    Post vital signs: Reviewed and stable  Level of consciousness: sedated  Complications: No apparent anesthesia complications

## 2014-01-29 NOTE — Brief Op Note (Signed)
01/29/2014  8:56 AM  PATIENT:  Harlen Labsuth E Barth  58 y.o. female  PRE-OPERATIVE DIAGNOSIS:  RIGHT DEQUERVAINS  POST-OPERATIVE DIAGNOSIS:  RIGHT DEQUERVAINS  PROCEDURE:  Procedure(s): RIGHT  FIRST DORSAL COMPARTMENT RELEASE (DEQUERVAIN) (Right)  SURGEON:  Surgeon(s) and Role:    * Wyn Forsterobert Sypher Jr V, MD - Primary  PHYSICIAN ASSISTANT:   ASSISTANTS: surgical tech   ANESTHESIA:   MAC  EBL:  Total I/O In: 200 [I.V.:200] Out: -   BLOOD ADMINISTERED:none  DRAINS: none   LOCAL MEDICATIONS USED:  XYLOCAINE   SPECIMEN:  No Specimen  DISPOSITION OF SPECIMEN:  N/A  COUNTS:  YES  TOURNIQUET:   Total Tourniquet Time Documented: Upper Arm (Right) - 9 minutes Total: Upper Arm (Right) - 9 minutes   DICTATION: .Other Dictation: Dictation Number [id number]  PLAN OF CARE: Discharge to home after PACU  PATIENT DISPOSITION:  PACU - hemodynamically stable.   Delay start of Pharmacological VTE agent (>24hrs) due to surgical blood loss or risk of bleeding: not applicable

## 2014-01-29 NOTE — Transfer of Care (Signed)
Immediate Anesthesia Transfer of Care Note  Patient: Tiffany Vargas  Procedure(s) Performed: Procedure(s): RIGHT  FIRST DORSAL COMPARTMENT RELEASE (DEQUERVAIN) (Right)  Patient Location: PACU  Anesthesia Type:MAC  Level of Consciousness: awake, alert , oriented and patient cooperative  Airway & Oxygen Therapy: Patient Spontanous Breathing and Patient connected to face mask oxygen  Post-op Assessment: Report given to PACU RN and Post -op Vital signs reviewed and stable  Post vital signs: Reviewed and stable  Complications: No apparent anesthesia complications

## 2014-01-29 NOTE — Anesthesia Preprocedure Evaluation (Signed)
Anesthesia Evaluation  Patient identified by MRN, date of birth, ID band Patient awake    Reviewed: Allergy & Precautions, H&P , NPO status , Patient's Chart, lab work & pertinent test results  Airway Mallampati: I TM Distance: >3 FB Neck ROM: Full    Dental   Pulmonary          Cardiovascular hypertension, Pt. on medications     Neuro/Psych    GI/Hepatic   Endo/Other    Renal/GU      Musculoskeletal   Abdominal   Peds  Hematology   Anesthesia Other Findings   Reproductive/Obstetrics                           Anesthesia Physical Anesthesia Plan  ASA: II  Anesthesia Plan: MAC   Post-op Pain Management:    Induction: Intravenous  Airway Management Planned: Simple Face Mask  Additional Equipment:   Intra-op Plan:   Post-operative Plan:   Informed Consent: I have reviewed the patients History and Physical, chart, labs and discussed the procedure including the risks, benefits and alternatives for the proposed anesthesia with the patient or authorized representative who has indicated his/her understanding and acceptance.     Plan Discussed with: CRNA and Surgeon  Anesthesia Plan Comments:         Anesthesia Quick Evaluation  

## 2014-01-29 NOTE — Discharge Instructions (Addendum)

## 2014-01-29 NOTE — Op Note (Signed)
NAMWilmer Vargas:  Mcconahy, Jamara                   ACCOUNT NO.:  [id number]  MEDICAL RECORD NO.:  [id number]  LOCATION:                                 FACILITY:  PHYSICIAN:  Katy Fitchobert V. Sypher, M.D.      DATE OF BIRTH:  DATE OF PROCEDURE:  01/29/2014 DATE OF DISCHARGE:                              OPERATIVE REPORT   PREOPERATIVE DIAGNOSIS:  Painful right first dorsal compartment stenosing tenosynovitis.  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  OPERATION:  Release of right first dorsal compartment with excision of septum.  OPERATING SURGEON:  Katy Fitchobert V. Sypher, MD  ASSISTANT:  Surgical technician.  ANESTHESIA:  2% lidocaine field block supplemented by IV sedation, total volume of 2% lidocaine is 3.5 mL.  SUPERVISING ANESTHESIOLOGIST:  Kaylyn LayerKevin D. Michelle Piperssey, MD.  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  She has had a many month history of pain on the radial aspect of her right wrist.  She has failed nonoperative measures including prolonged splinting, anti-inflammatory medication, and injection.  Due to the failure to respond to nonoperative measures, she was brought to the operating at this time for release of her right first dorsal compartment.  Detailed informed consent was provided by Dr. Michelle Piperssey regarding anesthesia choices.  Ms. Azucena KubaReid selected sedation and local anesthesia.  After detailed informed consent, I marked her right first dorsal compartment as the proper surgical site per protocol with a marking pen.  Ms. Azucena KubaReid was then transferred to room 1 of the [facility] and placed in supine position on the operating table.  Following Betadine prep and informed consent, the radial aspect of the wrist  was infiltrated with 3.5 mL of 2% lidocaine without epinephrine to obtain a regional block.  Lidocaine was instilled within the first dorsal compartment.  A total volume of 3.5 ml was employed.  Within a few minutes, excellent anesthesia was achieved.  The right hand and arm were then prepped with Betadine soap and solution, sterilely draped.  A pneumatic tourniquet was applied to the proximal right brachium.  Following exsanguination of the right arm with an Esmarch bandage, the arterial tourniquet was inflated to 220 mmHg.  A routine surgical time- out was accomplished.  The procedure commenced with a short oblique incision directly over the palpably thickened first dorsal compartment. Subcutaneous tissues were meticulously divided taking care to identify and gently retract cephalic vein in the radial superficial sensory branches.  The compartment was markedly thickened opaque and fibrotic. After careful protection of the sensory branches, we incised the compartment at its apex with a scalpel identifying a thick septum between the extensor pollicis brevis and the abductor pollicis longus tendons.  The compartment was released at its full length followed by excision of the septum.  The tendons were otherwise in good repair.  A cuff of fibrotic tenosynovium was excised from the extensor pollicis brevis just proximal to the entrance to the separate dorsal compartment.  The wound was  inspected for bleeding points followed by repair of the skin with intradermal 3-0 Prolene and Steri-Strips.  A compressive dressing was applied with sterile gauze, a Tegaderm followed by Ace wrap.  Ms. Azucena KubaReid is advised to begin range of motion exercise.  She will elevate her hand for 2 days.  I will see her back for followup in 1 week for suture removal.  For aftercare, she is provided a prescription for Tylenol with Codeine No. 3, 1 or 2 tablets p.o. q.4 hours p.r.n. pain, 30 tablets without  refill.     Katy Fitchobert V. Sypher, M.D.     RVS/MEDQ  D:  01/29/2014  T:  01/29/2014  Job:  [id number]

## 2014-01-30 ENCOUNTER — Encounter (HOSPITAL_BASED_OUTPATIENT_CLINIC_OR_DEPARTMENT_OTHER): Payer: Self-pay | Admitting: Orthopedic Surgery

## 2014-02-02 ENCOUNTER — Encounter: Payer: Self-pay | Admitting: Internal Medicine

## 2014-02-02 ENCOUNTER — Ambulatory Visit (INDEPENDENT_AMBULATORY_CARE_PROVIDER_SITE_OTHER): Payer: BC Managed Care – PPO | Admitting: Internal Medicine

## 2014-02-02 VITALS — BP 108/80 | HR 67 | Temp 98.5°F | Resp 12 | Ht 63.5 in | Wt 176.8 lb

## 2014-02-02 DIAGNOSIS — M654 Radial styloid tenosynovitis [de Quervain]: Secondary | ICD-10-CM | POA: Insufficient documentation

## 2014-02-02 DIAGNOSIS — E785 Hyperlipidemia, unspecified: Secondary | ICD-10-CM

## 2014-02-02 DIAGNOSIS — Z0000 Encounter for general adult medical examination without abnormal findings: Secondary | ICD-10-CM

## 2014-02-02 NOTE — Progress Notes (Signed)
   Subjective:    Patient ID: Tiffany Vargas, female    DOB: 11-04-1955, 58 y.o.   MRN: [id number]  HPI She is here for a physical;acute issues denied despite recent wrist surgery.  A heart healthy diet is followed; exercise encompasses 20 minutes 7  times per week as  walking without symptoms.  Family history is neg for premature coronary disease. Advanced cholesterol testing reveals  LDL goal is less than 100 ; ideally < 70 . There is medication compliance with the statin.  Low dose ASA not taken. BP <120/70-80.   Review of Systems Specifically denied are  chest pain, palpitations, dyspnea, or claudication.  Significant abdominal symptoms, memory deficit, or myalgias not present.        Objective:   Physical Exam Gen.: Healthy and well-nourished in appearance. Alert, appropriate and cooperative throughout exam. Appears younger than stated age  Head: Normocephalic without obvious abnormalities Eyes: No corneal or conjunctival inflammation noted. Pupils equal round reactive to light and accommodation. Extraocular motion intact.  Ears: External  ear exam reveals no significant lesions or deformities. Canals clear .TMs normal. Hearing is grossly normal bilaterally. Nose: External nasal exam reveals no deformity or inflammation. Nasal mucosa are pink and moist. No lesions or exudates noted.   Mouth: Oral mucosa and oropharynx reveal no lesions or exudates. Teeth in good repair. Neck: No deformities, masses, or tenderness noted. Range of motion slightly decreased. Thyroid normal. Lungs: Normal respiratory effort; chest expands symmetrically. Lungs are clear to auscultation without rales, wheezes, or increased work of breathing. Heart: Normal rate andNo murmur. Abdomen: Bowel sounds normal; abdomen soft and nontender. No masses, organomegaly or hernias noted. Genitalia: as per Gyn                                  Musculoskeletal/extremities: No deformity or scoliosis noted of  the thoracic  or lumbar spine.  No clubbing, cyanosis, edema, or significant extremity  deformity noted. Range of motion normal .Tone & strength normal. Hand joints normal.  Post op bandage RUE. Fingernail health good. Able to lie down & sit up w/o help. Negative SLR bilaterally Vascular: Carotid, radial artery, dorsalis pedis and  posterior tibial pulses are full and equal. No bruits present. Neurologic: Alert and oriented x3. Deep tendon reflexes symmetrical and normal.  Gait normal     Skin: Intact without suspicious lesions or rashes. Lymph: No cervical, axillary lymphadenopathy present. Psych: Mood and affect are normal. Normally interactive                                                                                        Assessment & Plan:  #1 comprehensive physical exam; no acute findings  Plan: see Orders  & Recommendations

## 2014-02-02 NOTE — Progress Notes (Signed)
Pre visit review using our clinic review tool, if applicable. No additional management support is needed unless otherwise documented below in the visit note. 

## 2014-02-02 NOTE — Patient Instructions (Signed)
Your next office appointment will be determined based upon review of your pending labs . Those instructions will be transmitted to you through My Chart    Minimal Blood Pressure Goal= AVERAGE < 140/90;  Ideal is an AVERAGE < 135/85. This AVERAGE should be calculated from @ least 5-7 BP readings taken @ different times of day on different days of week. You should not respond to isolated BP readings , but rather the AVERAGE for that week .Please bring your  blood pressure cuff to office visits to verify that it is reliable.It  can also be checked against the blood pressure device at the pharmacy.  

## 2014-02-04 ENCOUNTER — Other Ambulatory Visit (INDEPENDENT_AMBULATORY_CARE_PROVIDER_SITE_OTHER): Payer: BC Managed Care – PPO

## 2014-02-04 DIAGNOSIS — Z0000 Encounter for general adult medical examination without abnormal findings: Secondary | ICD-10-CM

## 2014-02-04 LAB — HEPATIC FUNCTION PANEL
ALT: 27 U/L (ref 0–35)
AST: 21 U/L (ref 0–37)
Albumin: 4.2 g/dL (ref 3.5–5.2)
Alkaline Phosphatase: 46 U/L (ref 39–117)
Bilirubin, Direct: 0.3 mg/dL (ref 0.0–0.3)
Total Bilirubin: 1.8 mg/dL — ABNORMAL HIGH (ref 0.2–1.2)
Total Protein: 6.8 g/dL (ref 6.0–8.3)

## 2014-02-04 LAB — LIPID PANEL
CHOLESTEROL: 165 mg/dL (ref 0–200)
HDL: 42 mg/dL (ref >39.00)
LDL CALC: 81 mg/dL (ref 0–99)
NonHDL: 123
Total CHOL/HDL Ratio: 4
Triglycerides: 211 mg/dL — ABNORMAL HIGH (ref 0.0–149.0)
VLDL: 42.2 mg/dL — AB (ref 0.0–40.0)

## 2014-02-04 LAB — TSH: TSH: 1.54 u[IU]/mL (ref 0.35–4.50)

## 2014-02-04 LAB — HEMOGLOBIN A1C: Hgb A1c MFr Bld: 5.8 % (ref 4.6–6.5)

## 2014-03-16 ENCOUNTER — Telehealth: Payer: Self-pay | Admitting: Internal Medicine

## 2014-03-16 MED ORDER — SIMVASTATIN 40 MG PO TABS: ORAL_TABLET | ORAL | Status: DC

## 2014-03-16 MED ORDER — BENAZEPRIL HCL 20 MG PO TABS: ORAL_TABLET | ORAL | Status: DC

## 2014-03-16 NOTE — Telephone Encounter (Signed)
Pt call request Rx for Simvastatin and Benazepril [facility] Please advise, pt stated Dr. Alwyn RenHopper wanted to wait until her lab result came back since 02/02/14. Please give pt a call if this is ok

## 2014-03-16 NOTE — Telephone Encounter (Signed)
[facility]

## 2014-03-25 ENCOUNTER — Encounter: Payer: BC Managed Care – PPO | Admitting: Gynecology

## 2014-03-31 ENCOUNTER — Encounter: Payer: BC Managed Care – PPO | Admitting: Gynecology

## 2014-04-07 ENCOUNTER — Encounter: Payer: Self-pay | Admitting: Gynecology

## 2014-04-27 ENCOUNTER — Encounter: Payer: Self-pay | Admitting: Gastroenterology

## 2014-05-13 ENCOUNTER — Ambulatory Visit (INDEPENDENT_AMBULATORY_CARE_PROVIDER_SITE_OTHER): Payer: BC Managed Care – PPO | Admitting: Gynecology

## 2014-05-13 ENCOUNTER — Encounter: Payer: Self-pay | Admitting: Gynecology

## 2014-05-13 VITALS — BP 130/80 | Ht 63.0 in | Wt 183.0 lb

## 2014-05-13 DIAGNOSIS — N926 Irregular menstruation, unspecified: Secondary | ICD-10-CM

## 2014-05-13 DIAGNOSIS — N9089 Other specified noninflammatory disorders of vulva and perineum: Secondary | ICD-10-CM

## 2014-05-13 DIAGNOSIS — Z01419 Encounter for gynecological examination (general) (routine) without abnormal findings: Secondary | ICD-10-CM

## 2014-05-13 NOTE — Progress Notes (Signed)
Harlen LabsRuth E Figuero Oct 17, 1955 [id number]        58 y.o.  G2P1011 for annual exam.  Doing well.  Past medical history,surgical history, problem list, medications, allergies, family history and social history were all reviewed and documented as reviewed in the EPIC chart.  ROS:  12 system ROS performed with pertinent positives and negatives included in the history, assessment and plan.   Additional significant findings :  none   Exam: [facility] personassistant Filed Vitals:   05/13/14 1055  BP: 130/80  Height: 5\' 3"  (1.6 m)  Weight: 183 lb (83.008 kg)   General appearance:  Normal affect, orientation and appearance. Skin: Grossly normal HEENT: Without gross lesions.  No cervical or supraclavicular adenopathy. Thyroid normal.  Lungs:  Clear without wheezing, rales or rhonchi Cardiac: RR, without RMG Abdominal:  Soft, nontender, without masses, guarding, rebound, organomegaly or hernia Breasts:  Examined lying and sitting without masses, retractions, discharge or axillary adenopathy. Pelvic:  Ext/BUS/vagina normal with skin tag mid left labia majora  Cervix normal  Uterus introverted, normal size, shape and contour, midline and mobile nontender   Adnexa  Without masses or tenderness    Anus and perineum  Normal with old hemorrhoidal skin tag  Rectovaginal  Normal sphincter tone without palpated masses or tenderness.    Assessment/Plan:  58 y.o. 832P1011 female for annual exam without current menses, not sexually active.   1. Irregular menses. Patient's menses have spaced out over the past year with LMP reported April 2015. No bleeding since. Having some hot flashes and sweats. Not overly bothersome to the patient. Will monitor at present. Report any atypical or prolonged bleeding. If regular menses then follow. If goes more than one year without menses and then bleeds patient knows to report. 2. Skin tag left mid labia majora. Present for years unchanged. Not bothersome to the patient. We'll continue to  follow and report any changes. 3. Pap/HPV negative 03/2012. No Pap smear done today. No history of significant abnormal Pap smears previously.  Plan repeat Pap smear at 5 your interval per current screening guidelines. 4. Mammography 03/2014. Continue with annual mammography. SBE monthly reviewed. 5. Colonoscopy 2009. Recommended repeat interval reported at 10 years. 6. Health maintenance. No routine blood work done and she reports this done at her primary physician's office. Follow up one year, sooner as needed.     Dara LordsFONTAINE,Yaqueline Gutter P MD, 11:45 AM 05/13/2014

## 2014-05-13 NOTE — Patient Instructions (Signed)
You may obtain a copy of any labs that were done today by logging onto MyChart as outlined in the instructions provided with your AVS (after visit summary). The office will not call with normal lab results but certainly if there are any significant abnormalities then we will contact you.   Health Maintenance, Female A healthy lifestyle and preventative care can promote health and wellness.  Maintain regular health, dental, and eye exams.  Eat a healthy diet. Foods like vegetables, fruits, whole grains, low-fat dairy products, and lean protein foods contain the nutrients you need without too many calories. Decrease your intake of foods high in solid fats, added sugars, and salt. Get information about a proper diet from your caregiver, if necessary.  Regular physical exercise is one of the most important things you can do for your health. Most adults should get at least 150 minutes of moderate-intensity exercise (any activity that increases your heart rate and causes you to sweat) each week. In addition, most adults need muscle-strengthening exercises on 2 or more days a week.   Maintain a healthy weight. The body mass index (BMI) is a screening tool to identify possible weight problems. It provides an estimate of body fat based on height and weight. Your caregiver can help determine your BMI, and can help you achieve or maintain a healthy weight. For adults 20 years and older:  A BMI below 18.5 is considered underweight.  A BMI of 18.5 to 24.9 is normal.  A BMI of 25 to 29.9 is considered overweight.  A BMI of 30 and above is considered obese.  Maintain normal blood lipids and cholesterol by exercising and minimizing your intake of saturated fat. Eat a balanced diet with plenty of fruits and vegetables. Blood tests for lipids and cholesterol should begin at age 61 and be repeated every 5 years. If your lipid or cholesterol levels are high, you are over 50, or you are a high risk for heart  disease, you may need your cholesterol levels checked more frequently.Ongoing high lipid and cholesterol levels should be treated with medicines if diet and exercise are not effective.  If you smoke, find out from your caregiver how to quit. If you do not use tobacco, do not start.  Lung cancer screening is recommended for adults aged 33 80 years who are at high risk for developing lung cancer because of a history of smoking. Yearly low-dose computed tomography (CT) is recommended for people who have at least a 30-pack-year history of smoking and are a current smoker or have quit within the past 15 years. A pack year of smoking is smoking an average of 1 pack of cigarettes a day for 1 year (for example: 1 pack a day for 30 years or 2 packs a day for 15 years). Yearly screening should continue until the smoker has stopped smoking for at least 15 years. Yearly screening should also be stopped for people who develop a health problem that would prevent them from having lung cancer treatment.  If you are pregnant, do not drink alcohol. If you are breastfeeding, be very cautious about drinking alcohol. If you are not pregnant and choose to drink alcohol, do not exceed 1 drink per day. One drink is considered to be 12 ounces (355 mL) of beer, 5 ounces (148 mL) of wine, or 1.5 ounces (44 mL) of liquor.  Avoid use of street drugs. Do not share needles with anyone. Ask for help if you need support or instructions about stopping  the use of drugs.  High blood pressure causes heart disease and increases the risk of stroke. Blood pressure should be checked at least every 1 to 2 years. Ongoing high blood pressure should be treated with medicines, if weight loss and exercise are not effective.  If you are 59 to 57 years old, ask your caregiver if you should take aspirin to prevent strokes.  Diabetes screening involves taking a blood sample to check your fasting blood sugar level. This should be done once every 3  years, after age 91, if you are within normal weight and without risk factors for diabetes. Testing should be considered at a younger age or be carried out more frequently if you are overweight and have at least 1 risk factor for diabetes.  Breast cancer screening is essential preventative care for women. You should practice "breast self-awareness." This means understanding the normal appearance and feel of your breasts and may include breast self-examination. Any changes detected, no matter how small, should be reported to a caregiver. Women in their 66s and 30s should have a clinical breast exam (CBE) by a caregiver as part of a regular health exam every 1 to 3 years. After age 101, women should have a CBE every year. Starting at age 100, women should consider having a mammogram (breast X-ray) every year. Women who have a family history of breast cancer should talk to their caregiver about genetic screening. Women at a high risk of breast cancer should talk to their caregiver about having an MRI and a mammogram every year.  Breast cancer gene (BRCA)-related cancer risk assessment is recommended for women who have family members with BRCA-related cancers. BRCA-related cancers include breast, ovarian, tubal, and peritoneal cancers. Having family members with these cancers may be associated with an increased risk for harmful changes (mutations) in the breast cancer genes BRCA1 and BRCA2. Results of the assessment will determine the need for genetic counseling and BRCA1 and BRCA2 testing.  The Pap test is a screening test for cervical cancer. Women should have a Pap test starting at age 57. Between ages 25 and 35, Pap tests should be repeated every 2 years. Beginning at age 37, you should have a Pap test every 3 years as long as the past 3 Pap tests have been normal. If you had a hysterectomy for a problem that was not cancer or a condition that could lead to cancer, then you no longer need Pap tests. If you are  between ages 50 and 76, and you have had normal Pap tests going back 10 years, you no longer need Pap tests. If you have had past treatment for cervical cancer or a condition that could lead to cancer, you need Pap tests and screening for cancer for at least 20 years after your treatment. If Pap tests have been discontinued, risk factors (such as a new sexual partner) need to be reassessed to determine if screening should be resumed. Some women have medical problems that increase the chance of getting cervical cancer. In these cases, your caregiver may recommend more frequent screening and Pap tests.  The human papillomavirus (HPV) test is an additional test that may be used for cervical cancer screening. The HPV test looks for the virus that can cause the cell changes on the cervix. The cells collected during the Pap test can be tested for HPV. The HPV test could be used to screen women aged 44 years and older, and should be used in women of any age  who have unclear Pap test results. After the age of 55, women should have HPV testing at the same frequency as a Pap test.  Colorectal cancer can be detected and often prevented. Most routine colorectal cancer screening begins at the age of 44 and continues through age 20. However, your caregiver may recommend screening at an earlier age if you have risk factors for colon cancer. On a yearly basis, your caregiver may provide home test kits to check for hidden blood in the stool. Use of a small camera at the end of a tube, to directly examine the colon (sigmoidoscopy or colonoscopy), can detect the earliest forms of colorectal cancer. Talk to your caregiver about this at age 86, when routine screening begins. Direct examination of the colon should be repeated every 5 to 10 years through age 13, unless early forms of pre-cancerous polyps or small growths are found.  Hepatitis C blood testing is recommended for all people born from [birth year] through 1964 and any  individual with known risks for hepatitis C.  Practice safe sex. Use condoms and avoid high-risk sexual practices to reduce the spread of sexually transmitted infections (STIs). Sexually active women aged 36 and younger should be checked for Chlamydia, which is a common sexually transmitted infection. Older women with new or multiple partners should also be tested for Chlamydia. Testing for other STIs is recommended if you are sexually active and at increased risk.  Osteoporosis is a disease in which the bones lose minerals and strength with aging. This can result in serious bone fractures. The risk of osteoporosis can be identified using a bone density scan. Women ages 20 and over and women at risk for fractures or osteoporosis should discuss screening with their caregivers. Ask your caregiver whether you should be taking a calcium supplement or vitamin D to reduce the rate of osteoporosis.  Menopause can be associated with physical symptoms and risks. Hormone replacement therapy is available to decrease symptoms and risks. You should talk to your caregiver about whether hormone replacement therapy is right for you.  Use sunscreen. Apply sunscreen liberally and repeatedly throughout the day. You should seek shade when your shadow is shorter than you. Protect yourself by wearing long sleeves, pants, a wide-brimmed hat, and sunglasses year round, whenever you are outdoors.  Notify your caregiver of new moles or changes in moles, especially if there is a change in shape or color. Also notify your caregiver if a mole is larger than the size of a pencil eraser.  Stay current with your immunizations. Document Released: 02/13/2011 Document Revised: 11/25/2012 Document Reviewed: 02/13/2011 [facility] Patient Information 2014 [employer].

## 2014-05-29 ENCOUNTER — Other Ambulatory Visit: Payer: Self-pay

## 2014-06-15 ENCOUNTER — Encounter: Payer: Self-pay | Admitting: Gynecology

## 2014-08-24 ENCOUNTER — Ambulatory Visit (INDEPENDENT_AMBULATORY_CARE_PROVIDER_SITE_OTHER): Payer: 59 | Admitting: Internal Medicine

## 2014-08-24 ENCOUNTER — Encounter: Payer: Self-pay | Admitting: Internal Medicine

## 2014-08-24 VITALS — BP 124/90 | HR 97 | Temp 98.5°F | Ht 63.0 in | Wt 185.0 lb

## 2014-08-24 DIAGNOSIS — J069 Acute upper respiratory infection, unspecified: Secondary | ICD-10-CM

## 2014-08-24 DIAGNOSIS — J209 Acute bronchitis, unspecified: Secondary | ICD-10-CM

## 2014-08-24 MED ORDER — HYDROCODONE-HOMATROPINE 5-1.5 MG/5ML PO SYRP: 5.0000 mL | ORAL_SOLUTION | Freq: Four times a day (QID) | ORAL | Status: DC | PRN

## 2014-08-24 MED ORDER — PREDNISONE 20 MG PO TABS: 20.0000 mg | ORAL_TABLET | Freq: Two times a day (BID) | ORAL | Status: DC

## 2014-08-24 MED ORDER — AMOXICILLIN 500 MG PO CAPS: 500.0000 mg | ORAL_CAPSULE | Freq: Three times a day (TID) | ORAL | Status: DC

## 2014-08-24 NOTE — Progress Notes (Signed)
Pre visit review using our clinic review tool, if applicable. No additional management support is needed unless otherwise documented below in the visit note. 

## 2014-08-24 NOTE — Progress Notes (Signed)
   Subjective:    Patient ID: Tiffany LabsRuth E Kosch, female    DOB: Nov 14, 1955, 59 y.o.   MRN: [id number]  HPI  Symptoms began 08/21/14 as a cough which improved over the next 24 hours. As of 08/23/14 she developed a sore throat and head congestion. The cough returned and was more severe  She will have paroxysms of cough lasting 3-4 minutes.  She's had discoloration from her head & chest, the volume of secretions was greater from the head than from the chest  She describes some sneezing. She also describes "rattling" chest sounds.  She's never smoked. She does not have a history of asthma. She is on an ACE inhibitor.  Review of Systems She denies frontal headache, facial pain, dental pain, otic pain, otic discharge  She is not having associated itchy, watery eyes.  There is no definite wheezing with the cough. She denies shortness of breath  She has no associated fever, chills, sweats    Objective:   Physical Exam  Positive or pertinent findings include: There is erythema of the nares and oropharynx without associated exudate. She has extremely harsh paroxysms of cough which are nonproductive.  General appearance:good health ;well nourished; no acute distress or increased work of breathing is present.  No  lymphadenopathy about the head, neck, or axilla noted.   Eyes: No conjunctival inflammation or lid edema is present. There is no scleral icterus.  Ears:  External ear exam shows no significant lesions or deformities.  Otoscopic examination reveals clear canals, tympanic membranes are intact bilaterally without bulging, retraction, inflammation or discharge.  Nose:  External nasal examination shows no deformity or inflammation. Nasal mucosa are dry without lesions or exudates. No septal dislocation or deviation.No obstruction to airflow.   Oral exam: Dental hygiene is good; lips and gums are healthy appearing.There is no oropharyngeal  exudate noted.   Neck:  No deformities, thyromegaly,  masses, or tenderness noted.   Supple with full range of motion without pain.   Heart:  Normal rate and regular rhythm. S1 and S2 normal without gallop, murmur, click, rub or other extra sounds.   Lungs:Chest clear to auscultation; no wheezes, rhonchi,rales ,or rubs present.No increased work of breathing.    Extremities:  No cyanosis, edema, or clubbing  noted    Skin: Warm & dry w/o jaundice or tenting.     Assessment & Plan:  #1 acute bronchitis w/o bronchospasm #2 URI, acute Plan: See orders and recommendations

## 2014-08-24 NOTE — Patient Instructions (Signed)
Plain Mucinex (NOT D) for thick secretions ;force NON dairy fluids .   Nasal cleansing in the shower as discussed with lather of mild shampoo.After 10 seconds wash off lather while  exhaling through nostrils. Make sure that all residual soap is removed to prevent irritation.  Flonase OR Nasacort AQ 1 spray in each nostril twice a day as needed. Use the "crossover" technique into opposite nostril spraying toward opposite ear @ 45 degree angle, not straight up into nostril.  Plain Allegra (NOT D )  160 daily , Loratidine 10 mg , OR Zyrtec 10 mg @ bedtime  as needed for itchy eyes & sneezing.  Carry room temperature water and sip liberally after coughing.   Fill the  prescription for Prednisone  it there is not dramatic improvement in the next 48 hours.

## 2014-08-25 ENCOUNTER — Telehealth: Payer: Self-pay | Admitting: Internal Medicine

## 2014-08-25 ENCOUNTER — Other Ambulatory Visit: Payer: Self-pay | Admitting: Internal Medicine

## 2014-08-25 MED ORDER — BENZONATATE 200 MG PO CAPS: 200.0000 mg | ORAL_CAPSULE | Freq: Three times a day (TID) | ORAL | Status: DC | PRN

## 2014-08-25 NOTE — Telephone Encounter (Signed)
Pt seen 1/11 and prescribed cough syrup that is causing her to vomit. Pt wants a different cough syrup called in. [phone]

## 2014-08-25 NOTE — Telephone Encounter (Signed)
Patient has been advised via voicemail to pick up Tessalon and start Prednisone twice daily after meals

## 2014-08-25 NOTE — Telephone Encounter (Signed)
New Rx sent for Tessalon; also start prednisone bid after meals

## 2014-09-07 ENCOUNTER — Other Ambulatory Visit: Payer: Self-pay

## 2014-09-07 MED ORDER — SIMVASTATIN 40 MG PO TABS: ORAL_TABLET | ORAL | Status: DC

## 2014-09-07 MED ORDER — BENAZEPRIL HCL 20 MG PO TABS: ORAL_TABLET | ORAL | Status: DC

## 2015-01-17 ENCOUNTER — Other Ambulatory Visit: Payer: Self-pay | Admitting: Internal Medicine

## 2015-02-04 ENCOUNTER — Other Ambulatory Visit (INDEPENDENT_AMBULATORY_CARE_PROVIDER_SITE_OTHER): Payer: 59

## 2015-02-04 ENCOUNTER — Ambulatory Visit (INDEPENDENT_AMBULATORY_CARE_PROVIDER_SITE_OTHER): Payer: 59 | Admitting: Internal Medicine

## 2015-02-04 ENCOUNTER — Other Ambulatory Visit: Payer: Self-pay | Admitting: Internal Medicine

## 2015-02-04 ENCOUNTER — Encounter: Payer: Self-pay | Admitting: Internal Medicine

## 2015-02-04 VITALS — BP 126/82 | HR 86 | Temp 98.6°F | Resp 16 | Ht 63.5 in | Wt 184.0 lb

## 2015-02-04 DIAGNOSIS — I10 Essential (primary) hypertension: Secondary | ICD-10-CM | POA: Diagnosis not present

## 2015-02-04 DIAGNOSIS — Z0000 Encounter for general adult medical examination without abnormal findings: Secondary | ICD-10-CM

## 2015-02-04 LAB — HEPATIC FUNCTION PANEL
ALT: 32 U/L (ref 0–35)
AST: 21 U/L (ref 0–37)
Albumin: 4.4 g/dL (ref 3.5–5.2)
Alkaline Phosphatase: 65 U/L (ref 39–117)
BILIRUBIN TOTAL: 2.1 mg/dL — AB (ref 0.2–1.2)
Bilirubin, Direct: 0.3 mg/dL (ref 0.0–0.3)
Total Protein: 7.1 g/dL (ref 6.0–8.3)

## 2015-02-04 LAB — CBC WITH DIFFERENTIAL/PLATELET
Basophils Absolute: 0 10*3/uL (ref 0.0–0.1)
Basophils Relative: 0.5 % (ref 0.0–3.0)
Eosinophils Absolute: 0.1 10*3/uL (ref 0.0–0.7)
Eosinophils Relative: 1.9 % (ref 0.0–5.0)
HCT: 43.5 % (ref 36.0–46.0)
HEMOGLOBIN: 14.8 g/dL (ref 12.0–15.0)
Lymphocytes Relative: 31.3 % (ref 12.0–46.0)
Lymphs Abs: 2.3 10*3/uL (ref 0.7–4.0)
MCHC: 34 g/dL (ref 30.0–36.0)
MCV: 86.9 fl (ref 78.0–100.0)
Monocytes Absolute: 0.5 10*3/uL (ref 0.1–1.0)
Monocytes Relative: 6.6 % (ref 3.0–12.0)
Neutro Abs: 4.4 10*3/uL (ref 1.4–7.7)
Neutrophils Relative %: 59.7 % (ref 43.0–77.0)
Platelets: 220 10*3/uL (ref 150.0–400.0)
RBC: 5 Mil/uL (ref 3.87–5.11)
RDW: 13.4 % (ref 11.5–15.5)
WBC: 7.3 10*3/uL (ref 4.0–10.5)

## 2015-02-04 LAB — BASIC METABOLIC PANEL
BUN: 12 mg/dL (ref 6–23)
CO2: 30 mEq/L (ref 19–32)
Calcium: 9.9 mg/dL (ref 8.4–10.5)
Chloride: 103 mEq/L (ref 96–112)
Creatinine, Ser: 0.85 mg/dL (ref 0.40–1.20)
GFR: 73.01 mL/min (ref >60.00)
Glucose, Bld: 96 mg/dL (ref 70–99)
Potassium: 4.8 mEq/L (ref 3.5–5.1)
SODIUM: 138 meq/L (ref 135–145)

## 2015-02-04 LAB — TSH: TSH: 1.05 u[IU]/mL (ref 0.35–4.50)

## 2015-02-04 MED ORDER — BENAZEPRIL HCL 20 MG PO TABS: ORAL_TABLET | ORAL | Status: DC

## 2015-02-04 NOTE — Patient Instructions (Signed)
Minimal Blood Pressure Goal= AVERAGE < 140/90;  Ideal is an AVERAGE < 135/85. This AVERAGE should be calculated from @ least 5-7 BP readings taken @ different times of day on different days of week. You should not respond to isolated BP readings , but rather the AVERAGE for that week .Please bring your  blood pressure cuff to office visits to verify that it is reliable.It  can also be checked against the blood pressure device at the pharmacy. Finger or wrist cuffs are not dependable; an arm cuff is. Your next office appointment will be determined based upon review of your pending labs .  Those written interpretation of the lab results and instructions will be transmitted to you by My Chart   Critical results will be called.   Followup as needed for any active or acute issue. Please report any significant change in your symptoms. 

## 2015-02-04 NOTE — Progress Notes (Signed)
   Subjective:    Patient ID: Tiffany Vargas, female    DOB: 1956/03/29, 59 y.o.   MRN: [id number]  HPI She is here for a physical;acute issues denied other than nocturia once nightly.  She has been compliant with her medicines without adverse effects. She's not on a heart healthy diet but does restrict salt. She walks 15 minutes daily without cardio pulmonary symptoms. She is not checking her blood pressure. Colonoscopy is up-to-date. She has no GI symptoms.   Review of Systems Chest pain, palpitations, tachycardia, exertional dyspnea, paroxysmal nocturnal dyspnea, claudication or edema are absent. No unexplained weight loss, abdominal pain, significant dyspepsia, dysphagia, melena, rectal bleeding, or persistently small caliber stools. Dysuria, pyuria, hematuria, frequency, or polyuria are denied. Change in hair, skin, nails denied. No bowel changes of constipation or diarrhea. No intolerance to heat or cold.     Objective:   Physical Exam  Gen.: Adequately nourished in appearance. Alert, appropriate and cooperative throughout exam. BMI: 32.08 Appears younger than stated age  Head: Normocephalic without obvious abnormalities  Eyes: No corneal or conjunctival inflammation noted. Pupils equal round reactive to light and accommodation. Extraocular motion intact.  Ears: External  ear exam reveals no significant lesions or deformities. Canals clear .TMs normal. Hearing is grossly normal bilaterally. Nose: External nasal exam reveals no deformity or inflammation. Nasal mucosa are pink and moist. No lesions or exudates noted.   Mouth: Oral mucosa and oropharynx reveal no lesions or exudates. Teeth in good repair. Neck: No deformities, masses, or tenderness noted. Range of motion decreased. Thyroid normal. Lungs: Normal respiratory effort; chest expands symmetrically. Lungs are clear to auscultation without rales, wheezes, or increased work of breathing. Heart: Normal rate and rhythm. Normal S1 and  S2. No gallop, click, or rub. No murmur. Abdomen: Bowel sounds normal; abdomen soft and nontender. No masses, organomegaly or hernias noted. Genitalia:  as per Gyn                                  Musculoskeletal/extremities: No deformity or scoliosis noted of  the thoracic or lumbar spine.  No clubbing, cyanosis, edema, or significant extremity  deformity noted.  Range of motion normal . Tone & strength normal. Hand joints normal.  Fingernail  health good. Slight crepitus of knees  Able to lie down & sit up w/o help.  Negative SLR bilaterally Vascular: Carotid, radial artery, dorsalis pedis and  posterior tibial pulses are full and equal. No bruits present. Neurologic: Alert and oriented x3. Deep tendon reflexes symmetrical and normal.  Gait normal        Skin: Intact without suspicious lesions or rashes. Lymph: No cervical, axillary lymphadenopathy present. Psych: Mood and affect are normal. Normally interactive                                                                                       Assessment & Plan:  #1 comprehensive physical exam; no acute findings  Plan: see Orders  & Recommendations

## 2015-02-04 NOTE — Progress Notes (Signed)
Pre visit review using our clinic review tool, if applicable. No additional management support is needed unless otherwise documented below in the visit note. 

## 2015-02-06 LAB — NMR LIPOPROFILE WITH LIPIDS
Cholesterol, Total: 185 mg/dL (ref 100–199)
HDL PARTICLE NUMBER: 34.6 umol/L (ref >=30.5)
HDL Size: 8.7 nm — ABNORMAL LOW (ref >=9.2)
HDL-C: 40 mg/dL (ref >39)
LDL (calc): 82 mg/dL (ref 0–99)
LDL PARTICLE NUMBER: 1667 nmol/L — AB (ref <1000)
LDL Size: 19.8 nm (ref >=20.8)
LP-IR SCORE: 83 — AB (ref <=45)
Large HDL-P: 5.8 umol/L (ref >=4.8)
Large VLDL-P: 20.4 nmol/L — ABNORMAL HIGH (ref <=2.7)
Small LDL Particle Number: 976 nmol/L — ABNORMAL HIGH (ref <=527)
TRIGLYCERIDES: 314 mg/dL — AB (ref 0–149)
VLDL Size: 58.6 nm — ABNORMAL HIGH (ref <=46.6)

## 2015-05-17 ENCOUNTER — Encounter: Payer: BC Managed Care – PPO | Admitting: Gynecology

## 2015-05-24 ENCOUNTER — Encounter: Payer: Self-pay | Admitting: Gynecology

## 2015-05-24 ENCOUNTER — Ambulatory Visit (INDEPENDENT_AMBULATORY_CARE_PROVIDER_SITE_OTHER): Payer: 59 | Admitting: Gynecology

## 2015-05-24 VITALS — BP 124/84 | Ht 63.5 in | Wt 188.2 lb

## 2015-05-24 DIAGNOSIS — N9089 Other specified noninflammatory disorders of vulva and perineum: Secondary | ICD-10-CM

## 2015-05-24 DIAGNOSIS — N951 Menopausal and female climacteric states: Secondary | ICD-10-CM | POA: Diagnosis not present

## 2015-05-24 DIAGNOSIS — Z01419 Encounter for gynecological examination (general) (routine) without abnormal findings: Secondary | ICD-10-CM | POA: Diagnosis not present

## 2015-05-24 NOTE — Patient Instructions (Signed)

## 2015-05-24 NOTE — Progress Notes (Signed)
ALAYNAH SCHUTTER February 18, 1956 [id number]        59 y.o.  G2P1011 for annual exam.  Doing well without complaints  Past medical history,surgical history, problem list, medications, allergies, family history and social history were all reviewed and documented as reviewed in the EPIC chart.  ROS:  Performed with pertinent positives and negatives included in the history, assessment and plan.   Additional significant findings :  Menopausal symptoms as discussed below   Exam: Sherrilyn Rist assistant Filed Vitals:   05/24/15 0901  BP: 124/84  Height: 5' 3.5" (1.613 m)  Weight: 188 lb 3.2 oz (85.367 kg)   General appearance:  Normal affect, orientation and appearance. Skin: Grossly normal HEENT: Without gross lesions.  No cervical or supraclavicular adenopathy. Thyroid normal.  Lungs:  Clear without wheezing, rales or rhonchi Cardiac: RR, without RMG Abdominal:  Soft, nontender, without masses, guarding, rebound, organomegaly or hernia Breasts:  Examined lying and sitting without masses, retractions, discharge or axillary adenopathy. Pelvic:  Ext/BUS/vagina with atrophic changes. Skin tag mid left labia majora.  Cervix normal  Uterus anteverted, normal size, shape and contour, midline and mobile nontender   Adnexa  Without masses or tenderness    Anus and perineum  Normal   Rectovaginal  Normal sphincter tone without palpated masses or tenderness.    Assessment/Plan:  59 y.o. G67P1011 female for annual exam without menses, abstinent birth control.   1. Menopausal symptoms. Patient over one year without menses. Is having some mild hot flushes and sweats. Overall doing well. Does not want intervention at this point. Options up to and including HRT discussed. Will follow up if they worsen and wants to rediscuss treatment options. Patient knows to report any vaginal bleeding. 2. Pap smear/HPV negative 2013. No Pap smear done today. No history of abnormal Pap smears. Plan repeat Pap smear at 5 year interval  per current screening guidelines. 3. Mammography scheduled this week. Continue with annual mammography. SBE monthly reviewed. 4. Colonoscopy 2009. Recommended repeat interval reported at 10 years. 5. DEXA years ago. Will plan further into the menopause. Increased calcium and vitamin D reviewed. 6. Health maintenance. No routine blood work done as she has this done at Dr. Frederik Pear office. Follow up 1 year, sooner as needed.   Dara Lords MD, 9:23 AM 05/24/2015

## 2015-05-25 LAB — URINALYSIS W MICROSCOPIC + REFLEX CULTURE
BILIRUBIN URINE: NEGATIVE
Bacteria, UA: NONE SEEN [HPF]
CRYSTALS: NONE SEEN [HPF]
Casts: NONE SEEN [LPF]
Glucose, UA: NEGATIVE
Hgb urine dipstick: NEGATIVE
Ketones, ur: NEGATIVE
Leukocytes, UA: NEGATIVE
Nitrite: NEGATIVE
Protein, ur: NEGATIVE
RBC / HPF: NONE SEEN RBC/HPF (ref <=2)
SPECIFIC GRAVITY, URINE: 1.02 (ref 1.001–1.035)
Squamous Epithelial / LPF: NONE SEEN [HPF] (ref <=5)
WBC UA: NONE SEEN WBC/HPF (ref <=5)
Yeast: NONE SEEN [HPF]
pH: 5.5 (ref 5.0–8.0)

## 2015-05-28 ENCOUNTER — Encounter: Payer: Self-pay | Admitting: Gynecology

## 2016-01-29 ENCOUNTER — Other Ambulatory Visit: Payer: Self-pay | Admitting: Internal Medicine

## 2016-02-03 ENCOUNTER — Ambulatory Visit (INDEPENDENT_AMBULATORY_CARE_PROVIDER_SITE_OTHER): Payer: 59 | Admitting: Internal Medicine

## 2016-02-03 ENCOUNTER — Encounter: Payer: Self-pay | Admitting: Internal Medicine

## 2016-02-03 ENCOUNTER — Other Ambulatory Visit: Payer: Self-pay | Admitting: Internal Medicine

## 2016-02-03 ENCOUNTER — Other Ambulatory Visit (INDEPENDENT_AMBULATORY_CARE_PROVIDER_SITE_OTHER): Payer: 59

## 2016-02-03 ENCOUNTER — Ambulatory Visit (INDEPENDENT_AMBULATORY_CARE_PROVIDER_SITE_OTHER)
Admission: RE | Admit: 2016-02-03 | Discharge: 2016-02-03 | Disposition: A | Discharge status: Home / Self Care | Payer: 59 | Source: Ambulatory Visit | Attending: Internal Medicine | Admitting: Internal Medicine

## 2016-02-03 VITALS — BP 96/74 | HR 73 | Temp 98.5°F | Resp 16 | Ht 63.5 in | Wt 148.0 lb

## 2016-02-03 DIAGNOSIS — Z8262 Family history of osteoporosis: Secondary | ICD-10-CM

## 2016-02-03 DIAGNOSIS — E785 Hyperlipidemia, unspecified: Secondary | ICD-10-CM

## 2016-02-03 DIAGNOSIS — I10 Essential (primary) hypertension: Secondary | ICD-10-CM | POA: Diagnosis not present

## 2016-02-03 DIAGNOSIS — Z0000 Encounter for general adult medical examination without abnormal findings: Secondary | ICD-10-CM

## 2016-02-03 DIAGNOSIS — Z780 Asymptomatic menopausal state: Secondary | ICD-10-CM

## 2016-02-03 DIAGNOSIS — E669 Obesity, unspecified: Secondary | ICD-10-CM | POA: Insufficient documentation

## 2016-02-03 LAB — COMPREHENSIVE METABOLIC PANEL
ALBUMIN: 4.4 g/dL (ref 3.5–5.2)
ALK PHOS: 51 U/L (ref 39–117)
ALT: 23 U/L (ref 0–35)
AST: 17 U/L (ref 0–37)
BILIRUBIN TOTAL: 2 mg/dL — AB (ref 0.2–1.2)
BUN: 16 mg/dL (ref 6–23)
CALCIUM: 9.6 mg/dL (ref 8.4–10.5)
CO2: 32 mEq/L (ref 19–32)
Chloride: 103 mEq/L (ref 96–112)
Creatinine, Ser: 0.71 mg/dL (ref 0.40–1.20)
GFR: 89.55 mL/min (ref >60.00)
GLUCOSE: 93 mg/dL (ref 70–99)
Potassium: 4.8 mEq/L (ref 3.5–5.1)
Sodium: 140 mEq/L (ref 135–145)
TOTAL PROTEIN: 6.7 g/dL (ref 6.0–8.3)

## 2016-02-03 LAB — CBC WITH DIFFERENTIAL/PLATELET
BASOS ABS: 0 10*3/uL (ref 0.0–0.1)
Basophils Relative: 0.6 % (ref 0.0–3.0)
EOS PCT: 2.5 % (ref 0.0–5.0)
Eosinophils Absolute: 0.1 10*3/uL (ref 0.0–0.7)
HEMATOCRIT: 42 % (ref 36.0–46.0)
HEMOGLOBIN: 14.4 g/dL (ref 12.0–15.0)
LYMPHS PCT: 41.4 % (ref 12.0–46.0)
Lymphs Abs: 2.1 10*3/uL (ref 0.7–4.0)
MCHC: 34.3 g/dL (ref 30.0–36.0)
MCV: 85.5 fl (ref 78.0–100.0)
MONOS PCT: 9 % (ref 3.0–12.0)
Monocytes Absolute: 0.5 10*3/uL (ref 0.1–1.0)
NEUTROS PCT: 46.5 % (ref 43.0–77.0)
Neutro Abs: 2.3 10*3/uL (ref 1.4–7.7)
Platelets: 225 10*3/uL (ref 150.0–400.0)
RBC: 4.92 Mil/uL (ref 3.87–5.11)
RDW: 13.8 % (ref 11.5–15.5)
WBC: 5 10*3/uL (ref 4.0–10.5)

## 2016-02-03 LAB — LIPID PANEL
CHOL/HDL RATIO: 3
Cholesterol: 137 mg/dL (ref 0–200)
HDL: 46.3 mg/dL (ref >39.00)
LDL Cholesterol: 68 mg/dL (ref 0–99)
NONHDL: 90.69
TRIGLYCERIDES: 113 mg/dL (ref 0.0–149.0)
VLDL: 22.6 mg/dL (ref 0.0–40.0)

## 2016-02-03 LAB — HEMOGLOBIN A1C: HEMOGLOBIN A1C: 5.5 % (ref 4.6–6.5)

## 2016-02-03 LAB — TSH: TSH: 1.06 u[IU]/mL (ref 0.35–4.50)

## 2016-02-03 MED ORDER — VITAMIN D3 25 MCG (1000 UT) PO CAPS: ORAL_CAPSULE | ORAL | Status: DC

## 2016-02-03 NOTE — Patient Instructions (Addendum)
Goal BP < 140/90. We will recheck your cholesterol in about three months after being off your medication.  Test(s) ordered today. Your results will be released to [place] (or called to you) after review, usually within 72hours after test completion. If any changes need to be made, you will be notified at that same time.  All other Health Maintenance issues reviewed.   All recommended immunizations and age-appropriate screenings are up-to-date or discussed.  No immunizations administered today.   Medications reviewed and updated.  Changes include stopping your blood pressure and cholesterol medications.  Please followup in one year  Health Maintenance, Female Adopting a healthy lifestyle and getting preventive care can go a long way to promote health and wellness. Talk with your health care provider about what schedule of regular examinations is right for you. This is a good chance for you to check in with your provider about disease prevention and staying healthy. In between checkups, there are plenty of things you can do on your own. Experts have done a lot of research about which lifestyle changes and preventive measures are most likely to keep you healthy. Ask your health care provider for more information. WEIGHT AND DIET  Eat a healthy diet  Be sure to include plenty of vegetables, fruits, low-fat dairy products, and lean protein.  Do not eat a lot of foods high in solid fats, added sugars, or salt.  Get regular exercise. This is one of the most important things you can do for your health.  Most adults should exercise for at least 150 minutes each week. The exercise should increase your heart rate and make you sweat (moderate-intensity exercise).  Most adults should also do strengthening exercises at least twice a week. This is in addition to the moderate-intensity exercise.  Maintain a healthy weight  Body mass index (BMI) is a measurement that can be used to identify possible  weight problems. It estimates body fat based on height and weight. Your health care provider can help determine your BMI and help you achieve or maintain a healthy weight.  For females 66 years of age and older:   A BMI below 18.5 is considered underweight.  A BMI of 18.5 to 24.9 is normal.  A BMI of 25 to 29.9 is considered overweight.  A BMI of 30 and above is considered obese.  Watch levels of cholesterol and blood lipids  You should start having your blood tested for lipids and cholesterol at 59 years of age, then have this test every 5 years.  You may need to have your cholesterol levels checked more often if:  Your lipid or cholesterol levels are high.  You are older than 59 years of age.  You are at high risk for heart disease.  CANCER SCREENING   Lung Cancer  Lung cancer screening is recommended for adults 66-24 years old who are at high risk for lung cancer because of a history of smoking.  A yearly low-dose CT scan of the lungs is recommended for people who:  Currently smoke.  Have quit within the past 15 years.  Have at least a 30-pack-year history of smoking. A pack year is smoking an average of one pack of cigarettes a day for 1 year.  Yearly screening should continue until it has been 15 years since you quit.  Yearly screening should stop if you develop a health problem that would prevent you from having lung cancer treatment.  Breast Cancer  Practice breast self-awareness. This means understanding  how your breasts normally appear and feel.  It also means doing regular breast self-exams. Let your health care provider know about any changes, no matter how small.  If you are in your 20s or 30s, you should have a clinical breast exam (CBE) by a health care provider every 1-3 years as part of a regular health exam.  If you are 59 or older, have a CBE every year. Also consider having a breast X-ray (mammogram) every year.  If you have a family history of  breast cancer, talk to your health care provider about genetic screening.  If you are at high risk for breast cancer, talk to your health care provider about having an MRI and a mammogram every year.  Breast cancer gene (BRCA) assessment is recommended for women who have family members with BRCA-related cancers. BRCA-related cancers include:  Breast.  Ovarian.  Tubal.  Peritoneal cancers.  Results of the assessment will determine the need for genetic counseling and BRCA1 and BRCA2 testing. Cervical Cancer Your health care provider may recommend that you be screened regularly for cancer of the pelvic organs (ovaries, uterus, and vagina). This screening involves a pelvic examination, including checking for microscopic changes to the surface of your cervix (Pap test). You may be encouraged to have this screening done every 3 years, beginning at age 61.  For women ages 41-65, health care providers may recommend pelvic exams and Pap testing every 3 years, or they may recommend the Pap and pelvic exam, combined with testing for human papilloma virus (HPV), every 5 years. Some types of HPV increase your risk of cervical cancer. Testing for HPV may also be done on women of any age with unclear Pap test results.  Other health care providers may not recommend any screening for nonpregnant women who are considered low risk for pelvic cancer and who do not have symptoms. Ask your health care provider if a screening pelvic exam is right for you.  If you have had past treatment for cervical cancer or a condition that could lead to cancer, you need Pap tests and screening for cancer for at least 20 years after your treatment. If Pap tests have been discontinued, your risk factors (such as having a new sexual partner) need to be reassessed to determine if screening should resume. Some women have medical problems that increase the chance of getting cervical cancer. In these cases, your health care provider may  recommend more frequent screening and Pap tests. Colorectal Cancer  This type of cancer can be detected and often prevented.  Routine colorectal cancer screening usually begins at 59 years of age and continues through 59 years of age.  Your health care provider may recommend screening at an earlier age if you have risk factors for colon cancer.  Your health care provider may also recommend using home test kits to check for hidden blood in the stool.  A small camera at the end of a tube can be used to examine your colon directly (sigmoidoscopy or colonoscopy). This is done to check for the earliest forms of colorectal cancer.  Routine screening usually begins at age 47.  Direct examination of the colon should be repeated every 5-10 years through 59 years of age. However, you may need to be screened more often if early forms of precancerous polyps or small growths are found. Skin Cancer  Check your skin from head to toe regularly.  Tell your health care provider about any new moles or changes in moles,  especially if there is a change in a mole's shape or color.  Also tell your health care provider if you have a mole that is larger than the size of a pencil eraser.  Always use sunscreen. Apply sunscreen liberally and repeatedly throughout the day.  Protect yourself by wearing long sleeves, pants, a wide-brimmed hat, and sunglasses whenever you are outside. HEART DISEASE, DIABETES, AND HIGH BLOOD PRESSURE   High blood pressure causes heart disease and increases the risk of stroke. High blood pressure is more likely to develop in:  People who have blood pressure in the high end of the normal range (130-139/85-89 mm Hg).  People who are overweight or obese.  People who are African American.  If you are 76-56 years of age, have your blood pressure checked every 3-5 years. If you are 7 years of age or older, have your blood pressure checked every year. You should have your blood  pressure measured twice--once when you are at a hospital or clinic, and once when you are not at a hospital or clinic. Record the average of the two measurements. To check your blood pressure when you are not at a hospital or clinic, you can use:  An automated blood pressure machine at a pharmacy.  A home blood pressure monitor.  If you are between 42 years and 22 years old, ask your health care provider if you should take aspirin to prevent strokes.  Have regular diabetes screenings. This involves taking a blood sample to check your fasting blood sugar level.  If you are at a normal weight and have a low risk for diabetes, have this test once every three years after 59 years of age.  If you are overweight and have a high risk for diabetes, consider being tested at a younger age or more often. PREVENTING INFECTION  Hepatitis B  If you have a higher risk for hepatitis B, you should be screened for this virus. You are considered at high risk for hepatitis B if:  You were born in a country where hepatitis B is common. Ask your health care provider which countries are considered high risk.  Your parents were born in a high-risk country, and you have not been immunized against hepatitis B (hepatitis B vaccine).  You have HIV or AIDS.  You use needles to inject street drugs.  You live with someone who has hepatitis B.  You have had sex with someone who has hepatitis B.  You get hemodialysis treatment.  You take certain medicines for conditions, including cancer, organ transplantation, and autoimmune conditions. Hepatitis C  Blood testing is recommended for:  Everyone born from [birth year] through 1964.  Anyone with known risk factors for hepatitis C. Sexually transmitted infections (STIs)  You should be screened for sexually transmitted infections (STIs) including gonorrhea and chlamydia if:  You are sexually active and are younger than 59 years of age.  You are older than 59 years  of age and your health care provider tells you that you are at risk for this type of infection.  Your sexual activity has changed since you were last screened and you are at an increased risk for chlamydia or gonorrhea. Ask your health care provider if you are at risk.  If you do not have HIV, but are at risk, it may be recommended that you take a prescription medicine daily to prevent HIV infection. This is called pre-exposure prophylaxis (PrEP). You are considered at risk if:  You are sexually active  and do not regularly use condoms or know the HIV status of your partner(s).  You take drugs by injection.  You are sexually active with a partner who has HIV. Talk with your health care provider about whether you are at high risk of being infected with HIV. If you choose to begin PrEP, you should first be tested for HIV. You should then be tested every 3 months for as long as you are taking PrEP.  PREGNANCY   If you are premenopausal and you may become pregnant, ask your health care provider about preconception counseling.  If you may become pregnant, take 400 to 800 micrograms (mcg) of folic acid every day.  If you want to prevent pregnancy, talk to your health care provider about birth control (contraception). OSTEOPOROSIS AND MENOPAUSE   Osteoporosis is a disease in which the bones lose minerals and strength with aging. This can result in serious bone fractures. Your risk for osteoporosis can be identified using a bone density scan.  If you are 51 years of age or older, or if you are at risk for osteoporosis and fractures, ask your health care provider if you should be screened.  Ask your health care provider whether you should take a calcium or vitamin D supplement to lower your risk for osteoporosis.  Menopause may have certain physical symptoms and risks.  Hormone replacement therapy may reduce some of these symptoms and risks. Talk to your health care provider about whether hormone  replacement therapy is right for you.  HOME CARE INSTRUCTIONS   Schedule regular health, dental, and eye exams.  Stay current with your immunizations.   Do not use any tobacco products including cigarettes, chewing tobacco, or electronic cigarettes.  If you are pregnant, do not drink alcohol.  If you are breastfeeding, limit how much and how often you drink alcohol.  Limit alcohol intake to no more than 1 drink per day for nonpregnant women. One drink equals 12 ounces of beer, 5 ounces of wine, or 1 ounces of hard liquor.  Do not use street drugs.  Do not share needles.  Ask your health care provider for help if you need support or information about quitting drugs.  Tell your health care provider if you often feel depressed.  Tell your health care provider if you have ever been abused or do not feel safe at home.   This information is not intended to replace advice given to you by your health care provider. Make sure you discuss any questions you have with your health care provider.   Document Released: 02/13/2011 Document Revised: 08/21/2014 Document Reviewed: 07/02/2013 Elsevier Interactive Patient Education [employer].

## 2016-02-03 NOTE — Assessment & Plan Note (Signed)
Stressed exercise, decreased portions

## 2016-02-03 NOTE — Assessment & Plan Note (Addendum)
On simvastatin 40 mg daily Check lipids, cmp Will try stopping medication  Recheck lipids in 3 months -- if too high will need to restart simvastatin

## 2016-02-03 NOTE — Assessment & Plan Note (Addendum)
BP well controlled, on the low side With weight loss she likely does not need the medication at this dose and may not need the medication at all Stop medication  -- she will monitor BP and call if elevated  Goal < 140/90

## 2016-02-03 NOTE — Progress Notes (Signed)
Pre visit review using our clinic review tool, if applicable. No additional management support is needed unless otherwise documented below in the visit note. 

## 2016-02-03 NOTE — Progress Notes (Signed)
Subjective:    Patient ID: Tiffany LabsRuth E Fierro, female    DOB: 08/07/1956, 60 y.o.   MRN: [id number]  HPI She is here to establish with a new pcp.  She is here for a physical exam.   Since thanksgiving she has lost 40 lbs doing weight watchers.  She is trying to get 15-20  minutes of walking in daily, longer on the weekends.    Hypertension: She is taking her medication daily. She is compliant with a low sodium diet.  She denies chest pain, palpitations, edema, shortness of breath and regular headaches. She is exercising regularly.  She does not monitor her blood pressure at home, but it has been low recently at a couple of doctor's visit.    Hyperlipidemia: She is taking her medication daily. She is compliant with a low fat/cholesterol diet. She is exercising regularly. She denies myalgias.   She has pain in her right forearm.  She has numbness/tingling in her right arm.  She has seen a hand orthopedic and they think it is her neck.  She has some arthritis in her neck and an EMG was ordered.  She has been referred to ortho to evaluate for cervical radiculopathy.   Medications and allergies reviewed with patient and updated if appropriate.  Patient Active Problem List   Diagnosis Date Noted  . Obese 02/03/2016  . De Quervain's tenosynovitis, right 02/02/2014  . Allergic rhinitis 06/12/2012  . Bilateral cataracts 03/27/2012  . GILBERT'S SYNDROME 11/28/2007  . Hyperlipidemia 11/14/2007  . Essential hypertension 11/14/2007  . MIGRAINES, HX OF 11/14/2007    Current Outpatient Prescriptions on File Prior to Visit  Medication Sig Dispense Refill  . benazepril (LOTENSIN) 20 MG tablet TAKE 1 TABLET BY MOUTH EVERY evening 90 tablet 3  . cetirizine (ZYRTEC) 10 MG tablet Take 10 mg by mouth daily.      . Multiple Vitamins-Minerals (MULTIVITAMIN PO) Take 1 tablet by mouth daily.     . simvastatin (ZOCOR) 40 MG tablet Take 1 tablet by mouth at  bedtime 90 tablet 3   No current  facility-administered medications on file prior to visit.    Past Medical History  Diagnosis Date  . Hypertension   . Elevated cholesterol   . Cataract     OS; Dr Lisette Grinderarlson , [facility]    Past Surgical History  Procedure Laterality Date  . Cesarean section  1995  . Lasik  08/2003  . Dilation and curettage of uterus  1999    Miscarriage  . Tonsillectomy and adenoidectomy  1964  . Colonoscopy  2009    Negative  . Cataract extraction  06/20/2012    right/LEFT eye, lens implant , Dr Lisette Grinderarlson , [facility]  . Dorsal compartment release Right 01/29/2014    Procedure: RIGHT  FIRST DORSAL COMPARTMENT RELEASE (DEQUERVAIN);  Surgeon: Wyn Forsterobert Sypher Jr V, MD;  Location:  SURGERY CENTER;  Service: Orthopedics;  Laterality: Right;    Social History   Social History  . Marital Status: Married    Spouse Name: N/A  . Number of Children: N/A  . Years of Education: N/A   Social History Main Topics  . Smoking status: Never Smoker   . Smokeless tobacco: Never Used  . Alcohol Use: 3.6 oz/week    6 Glasses of wine per week     Comment: occasional  wine or liquor  . Drug Use: No  . Sexual Activity: No   Other Topics Concern  . None   Social History  Narrative   Exercise; walking      accountant    Family History  Problem Relation Age of Onset  . Breast cancer Maternal Grandmother 1570  . Hypertension Mother   . Leukemia Mother     maternal family from [place]  . Diabetes Mother     diet & oral meds  . Dementia Mother 1394  . Osteoporosis Mother   . Heart failure Father     CHF  . Cataracts Father   . Cataracts Sister   . Cataracts Sister   . Leukemia Maternal Uncle   . Leukemia Maternal Grandfather   . Stroke Neg Hx     Review of Systems  Constitutional: Negative for fever, chills and fatigue.  Eyes: Negative for visual disturbance.  Respiratory: Negative for cough, shortness of breath and wheezing.   Cardiovascular: Negative for chest pain, palpitations and leg swelling.    Gastrointestinal: Negative for nausea, abdominal pain, diarrhea and blood in stool.       No gerd  Endocrine: Negative for polydipsia and polyuria.  Genitourinary: Negative for dysuria and hematuria.  Musculoskeletal: Positive for neck stiffness. Negative for myalgias, back pain and arthralgias.  Skin: Negative for color change and rash.  Neurological: Positive for numbness (right arm). Negative for dizziness, weakness, light-headedness and headaches.  Psychiatric/Behavioral: Negative for dysphoric mood. The patient is not nervous/anxious.        Objective:   Filed Vitals:   02/03/16 0756  BP: 96/74  Pulse: 73  Temp: 98.5 F (36.9 C)  Resp: 16   Filed Weights   02/03/16 0756  Weight: 148 lb (67.132 kg)   Body mass index is 25.8 kg/(m^2).   Physical Exam Constitutional: She appears well-developed and well-nourished. No distress.  HENT:  Head: Normocephalic and atraumatic.  Right Ear: External ear normal. Normal ear canal and TM Left Ear: External ear normal.  Normal ear canal and TM Mouth/Throat: Oropharynx is clear and moist.  Eyes: Conjunctivae and EOM are normal.  Neck: Neck supple. No tracheal deviation present. No thyromegaly present.  No carotid bruit  Cardiovascular: Normal rate, regular rhythm and normal heart sounds.   No murmur heard.  No edema. Pulmonary/Chest: Effort normal and breath sounds normal. No respiratory distress. She has no wheezes. She has no rales.  Breast: deferred to Gyn Abdominal: Soft. She exhibits no distension. There is no tenderness.  Lymphadenopathy: She has no cervical adenopathy.  Skin: Skin is warm and dry. She is not diaphoretic.  Psychiatric: She has a normal mood and affect. Her behavior is normal.         Assessment & Plan:   Physical exam: Screening blood work  ordered Immunizations  Up to date - tetanus due this year Colonoscopy  Up to date  Mammogram  Up to date  Gyn  Up to date  Dexa - postmenopausal, mom had OP -  will get base line dexa - ordered today Eye exams  - Up to date  EKG - normal in 2015 Exercise -regular walking Weight - has lost 40 lbs this year - doing weight watchers and exercising Skin  - saw derm for skin check one month ago Substance abuse - none   See Problem List for Assessment and Plan of chronic medical problems.  F/u annually

## 2016-02-11 ENCOUNTER — Encounter: Payer: Self-pay | Admitting: Internal Medicine

## 2016-02-29 ENCOUNTER — Other Ambulatory Visit: Payer: Self-pay | Admitting: Internal Medicine

## 2016-03-08 ENCOUNTER — Other Ambulatory Visit: Payer: Self-pay | Admitting: Orthopaedic Surgery

## 2016-03-08 DIAGNOSIS — M79601 Pain in right arm: Secondary | ICD-10-CM

## 2016-03-08 DIAGNOSIS — M479 Spondylosis, unspecified: Secondary | ICD-10-CM

## 2016-03-17 ENCOUNTER — Ambulatory Visit
Admission: RE | Admit: 2016-03-17 | Discharge: 2016-03-17 | Disposition: A | Discharge status: Home / Self Care | Payer: 59 | Source: Ambulatory Visit | Attending: Orthopaedic Surgery | Admitting: Orthopaedic Surgery

## 2016-03-17 DIAGNOSIS — M479 Spondylosis, unspecified: Secondary | ICD-10-CM

## 2016-03-17 DIAGNOSIS — M79601 Pain in right arm: Secondary | ICD-10-CM

## 2016-05-24 ENCOUNTER — Encounter: Payer: 59 | Admitting: Gynecology

## 2016-06-08 ENCOUNTER — Encounter: Payer: 59 | Admitting: Gynecology

## 2016-06-09 ENCOUNTER — Encounter: Payer: Self-pay | Admitting: Gynecology

## 2016-06-13 ENCOUNTER — Ambulatory Visit (INDEPENDENT_AMBULATORY_CARE_PROVIDER_SITE_OTHER): Payer: 59

## 2016-06-13 DIAGNOSIS — Z23 Encounter for immunization: Secondary | ICD-10-CM

## 2016-06-21 ENCOUNTER — Ambulatory Visit (INDEPENDENT_AMBULATORY_CARE_PROVIDER_SITE_OTHER): Payer: 59 | Admitting: Gynecology

## 2016-06-21 ENCOUNTER — Encounter: Payer: Self-pay | Admitting: Gynecology

## 2016-06-21 VITALS — BP 140/86 | Ht 63.5 in | Wt 141.0 lb

## 2016-06-21 DIAGNOSIS — N952 Postmenopausal atrophic vaginitis: Secondary | ICD-10-CM

## 2016-06-21 DIAGNOSIS — Z01419 Encounter for gynecological examination (general) (routine) without abnormal findings: Secondary | ICD-10-CM | POA: Diagnosis not present

## 2016-06-21 DIAGNOSIS — N9089 Other specified noninflammatory disorders of vulva and perineum: Secondary | ICD-10-CM | POA: Diagnosis not present

## 2016-06-21 NOTE — Patient Instructions (Signed)

## 2016-06-21 NOTE — Addendum Note (Signed)
Addended by: Dayna BarkerGARDNER, KIMBERLY K on: 06/21/2016 01:04 PM   Modules accepted: Orders

## 2016-06-21 NOTE — Progress Notes (Signed)
    Tiffany LabsRuth E Vargas 1955/12/04 [id number]        60 y.o.  G2P1011  for annual exam.  Doing well  Past medical history,surgical history, problem list, medications, allergies, family history and social history were all reviewed and documented as reviewed in the EPIC chart.  ROS:  Performed with pertinent positives and negatives included in the history, assessment and plan.   Additional significant findings :  None   Exam: Tiffany PortelaKim Vargas assistant Vitals:   06/21/16 1053  BP: 140/86  Weight: 141 lb (64 kg)  Height: 5' 3.5" (1.613 m)   Body mass index is 24.59 kg/m.  General appearance:  Normal affect, orientation and appearance. Skin: Grossly normal HEENT: Without gross lesions.  No cervical or supraclavicular adenopathy. Thyroid normal.  Lungs:  Clear without wheezing, rales or rhonchi Cardiac: RR, without RMG Abdominal:  Soft, nontender, without masses, guarding, rebound, organomegaly or hernia Breasts:  Examined lying and sitting without masses, retractions, discharge or axillary adenopathy. Pelvic:  Ext, BUS, Vagina with atrophic changes. Classic benign-appearing skin tag mid left labia majora. Small classic sebaceous cyst left lower labia majora  Cervix with atrophic changes. Pap smear done  Uterus anteverted, normal size, shape and contour, midline and mobile nontender   Adnexa without masses or tenderness    Anus and perineum normal   Rectovaginal normal sphincter tone without palpated masses or tenderness.    Assessment/Plan:  60 y.o. 672P1011 female for annual exam.  1. Postmenopausal/atrophic genital changes. No significant hot flushes, night sweats, vaginal dryness or any vaginal bleeding. Continue to monitor report any issues or vaginal bleeding. 2. Small skin tag left labia majora present for years not bothersome to the patient. New-onset small classic appearing sebaceous cyst lower left labia majora not bothersome to the patient. Patient prefers to observe both and will  follow up if they change. 3. Mammography 05/2016. Continue with annual mammography when due. SBE monthly reviewed. 4. Colonoscopy 2009 with reported repeat interval 10 years. 5. Pap smear/HPV 2013. Pap smear done today as patient feels uncomfortable waiting any longer. No history of significant abnormal Pap smears recently. 6. DEXA 2017 normal. Repeat at 5 year interval. 7. Health maintenance. No routine lab work done as patient does this elsewhere. Follow up 1 year, sooner as needed.   Tiffany Vargas,Tiffany Vargas P MD, 11:24 AM 06/21/2016

## 2016-06-26 LAB — PAP IG W/ RFLX HPV ASCU

## 2016-08-02 ENCOUNTER — Other Ambulatory Visit (INDEPENDENT_AMBULATORY_CARE_PROVIDER_SITE_OTHER): Payer: 59

## 2016-08-02 DIAGNOSIS — Z0000 Encounter for general adult medical examination without abnormal findings: Secondary | ICD-10-CM | POA: Diagnosis not present

## 2016-08-02 DIAGNOSIS — E785 Hyperlipidemia, unspecified: Secondary | ICD-10-CM

## 2016-08-02 LAB — COMPREHENSIVE METABOLIC PANEL
ALK PHOS: 51 U/L (ref 39–117)
ALT: 28 U/L (ref 0–35)
AST: 20 U/L (ref 0–37)
Albumin: 4.4 g/dL (ref 3.5–5.2)
BUN: 18 mg/dL (ref 6–23)
CALCIUM: 9.7 mg/dL (ref 8.4–10.5)
CO2: 31 meq/L (ref 19–32)
Chloride: 104 mEq/L (ref 96–112)
Creatinine, Ser: 0.7 mg/dL (ref 0.40–1.20)
GFR: 90.88 mL/min (ref >60.00)
GLUCOSE: 103 mg/dL — AB (ref 70–99)
POTASSIUM: 4.6 meq/L (ref 3.5–5.1)
Sodium: 142 mEq/L (ref 135–145)
TOTAL PROTEIN: 6.9 g/dL (ref 6.0–8.3)
Total Bilirubin: 2.1 mg/dL — ABNORMAL HIGH (ref 0.2–1.2)

## 2016-08-02 LAB — LIPID PANEL
Cholesterol: 180 mg/dL (ref 0–200)
HDL: 55.8 mg/dL (ref >39.00)
LDL Cholesterol: 107 mg/dL — ABNORMAL HIGH (ref 0–99)
NONHDL: 124.28
TRIGLYCERIDES: 85 mg/dL (ref 0.0–149.0)
Total CHOL/HDL Ratio: 3
VLDL: 17 mg/dL (ref 0.0–40.0)

## 2016-08-02 LAB — TSH: TSH: 1.35 u[IU]/mL (ref 0.35–4.50)

## 2016-08-05 ENCOUNTER — Encounter: Payer: Self-pay | Admitting: Internal Medicine

## 2017-06-22 ENCOUNTER — Ambulatory Visit (INDEPENDENT_AMBULATORY_CARE_PROVIDER_SITE_OTHER): Payer: BLUE CROSS/BLUE SHIELD | Admitting: Gynecology

## 2017-06-22 ENCOUNTER — Encounter: Payer: Self-pay | Admitting: Gynecology

## 2017-06-22 VITALS — BP 120/78 | Ht 63.0 in | Wt 148.0 lb

## 2017-06-22 DIAGNOSIS — N952 Postmenopausal atrophic vaginitis: Secondary | ICD-10-CM | POA: Diagnosis not present

## 2017-06-22 DIAGNOSIS — Z01411 Encounter for gynecological examination (general) (routine) with abnormal findings: Secondary | ICD-10-CM

## 2017-06-22 DIAGNOSIS — Z13220 Encounter for screening for lipoid disorders: Secondary | ICD-10-CM | POA: Diagnosis not present

## 2017-06-22 LAB — CBC WITH DIFFERENTIAL/PLATELET
BASOS PCT: 0.7 %
Basophils Absolute: 32 cells/uL (ref 0–200)
EOS PCT: 2.4 %
Eosinophils Absolute: 108 cells/uL (ref 15–500)
HCT: 44.2 % (ref 35.0–45.0)
HEMOGLOBIN: 14.9 g/dL (ref 11.7–15.5)
Lymphs Abs: 2120 cells/uL (ref 850–3900)
MCH: 29 pg (ref 27.0–33.0)
MCHC: 33.7 g/dL (ref 32.0–36.0)
MCV: 86.2 fL (ref 80.0–100.0)
MONOS PCT: 8.4 %
MPV: 10.5 fL (ref 7.5–12.5)
Neutro Abs: 1863 cells/uL (ref 1500–7800)
Neutrophils Relative %: 41.4 %
PLATELETS: 206 10*3/uL (ref 140–400)
RBC: 5.13 10*6/uL — AB (ref 3.80–5.10)
RDW: 12.8 % (ref 11.0–15.0)
TOTAL LYMPHOCYTE: 47.1 %
WBC mixed population: 378 cells/uL (ref 200–950)
WBC: 4.5 10*3/uL (ref 3.8–10.8)

## 2017-06-22 LAB — COMPREHENSIVE METABOLIC PANEL
AG Ratio: 1.9 (calc) (ref 1.0–2.5)
ALBUMIN MSPROF: 4.4 g/dL (ref 3.6–5.1)
ALT: 21 U/L (ref 6–29)
AST: 19 U/L (ref 10–35)
Alkaline phosphatase (APISO): 56 U/L (ref 33–130)
BUN: 13 mg/dL (ref 7–25)
CHLORIDE: 102 mmol/L (ref 98–110)
CO2: 31 mmol/L (ref 20–32)
CREATININE: 0.75 mg/dL (ref 0.50–0.99)
Calcium: 9.7 mg/dL (ref 8.6–10.4)
GLOBULIN: 2.3 g/dL (ref 1.9–3.7)
GLUCOSE: 88 mg/dL (ref 65–99)
POTASSIUM: 3.7 mmol/L (ref 3.5–5.3)
SODIUM: 139 mmol/L (ref 135–146)
TOTAL PROTEIN: 6.7 g/dL (ref 6.1–8.1)
Total Bilirubin: 2.9 mg/dL — ABNORMAL HIGH (ref 0.2–1.2)

## 2017-06-22 LAB — LIPID PANEL
CHOL/HDL RATIO: 3.6 (calc) (ref <5.0)
Cholesterol: 212 mg/dL — ABNORMAL HIGH (ref <200)
HDL: 59 mg/dL (ref >50)
LDL Cholesterol (Calc): 134 mg/dL (calc) — ABNORMAL HIGH
NON-HDL CHOLESTEROL (CALC): 153 mg/dL — AB (ref <130)
Triglycerides: 88 mg/dL (ref <150)

## 2017-06-22 NOTE — Patient Instructions (Signed)
Follow-up in 1 year, sooner as needed. 

## 2017-06-22 NOTE — Progress Notes (Signed)
    Tiffany LabsRuth E Vargas 1955/10/05 [id number]        61 y.o.  G2P1011 for annual gynecologic exam.  Doing well from a gynecologic standpoint.  Recently lost her husband to a heart attack.  Past medical history,surgical history, problem list, medications, allergies, family history and social history were all reviewed and documented as reviewed in the EPIC chart.  ROS:  Performed with pertinent positives and negatives included in the history, assessment and plan.   Additional significant findings : None   Exam: Tiffany PortelaKim Vargas assistant Vitals:   06/22/17 0856  BP: 120/78  Weight: 148 lb (67.1 kg)  Height: 5\' 3"  (1.6 m)   Body mass index is 26.22 kg/m.  General appearance:  Normal affect, orientation and appearance. Skin: Grossly normal HEENT: Without gross lesions.  No cervical or supraclavicular adenopathy. Thyroid normal.  Lungs:  Clear without wheezing, rales or rhonchi Cardiac: RR, without RMG Abdominal:  Soft, nontender, without masses, guarding, rebound, organomegaly or hernia Breasts:  Examined lying and sitting without masses, retractions, discharge or axillary adenopathy. Pelvic:  Ext, BUS, Vagina: With atrophic changes.  9 appearing skin tag mid left labia majora.  Small classic sebaceous cyst left lower labia majora  Cervix: With atrophic changes  Uterus: Anteverted, normal size, shape and contour, midline and mobile nontender   Adnexa: Without masses or tenderness    Anus and perineum: Normal   Rectovaginal: Normal sphincter tone without palpated masses or tenderness.    Assessment/Plan:  61 y.o. 42P1011 female for annual gynecologic exam.   1. Was menopausal/atrophic genital changes.  No significant hot flushes, night sweats, vaginal dryness or any vaginal bleeding.  Continue to monitor and report any issues or bleeding. 2. Stable left vulvar skin tag and sebaceous cyst.  Unchanged over years observation.  Not bothersome to the patient.  Continue to monitor and report any  changes. 3. Mammography 05/2017.  Continue with annual mammography next year.  Breast exam normal today. 4. Pap smear 2017.  No Pap smear done today.  No history of abnormal Pap smears.  Plan repeat Pap smear 3-year interval per current screening guidelines. 5. DEXA 2017 normal.  Plan repeat DEXA at age 61.  Increase calcium vitamin D. 6. Colonoscopy 2009.  Plan repeat next year at 10-year interval. 7. Health maintenance.  Patient requests baseline Vargas to have available for her primary physician.  She is fasting.  CBC, CMP, lipid profile ordered.  TSH normal last year.  Follow-up in 1 year, sooner as needed.   Dara LordsFONTAINE,Tiffany Vargas P MD, 9:31 AM 06/22/2017

## 2017-06-27 ENCOUNTER — Other Ambulatory Visit: Payer: Self-pay | Admitting: Gynecology

## 2017-06-27 DIAGNOSIS — E7800 Pure hypercholesterolemia, unspecified: Secondary | ICD-10-CM

## 2017-06-27 DIAGNOSIS — R17 Unspecified jaundice: Secondary | ICD-10-CM

## 2017-09-06 ENCOUNTER — Telehealth: Payer: Self-pay

## 2017-09-06 ENCOUNTER — Telehealth: Payer: Self-pay | Admitting: Family Medicine

## 2017-09-06 NOTE — Telephone Encounter (Signed)
Please help call pt and offer an transfer appt with Dr. Dayton MartesAron.   Thanks.

## 2017-09-06 NOTE — Telephone Encounter (Signed)
I have scheduled patient for 2.11.19 @ 11:30am/thx dmf

## 2017-09-06 NOTE — Telephone Encounter (Signed)
Yes okay with me.

## 2017-09-06 NOTE — Telephone Encounter (Signed)
Ok with me 

## 2017-09-06 NOTE — Telephone Encounter (Signed)
Patient would like to change PCP from Dr. Lawerance BachBurns to Dr. Dayton MartesAron due to convenience of location/plz advise/thx dmf   Copied from CRM [phone]. Topic: Inquiry >> Sep 05, 2017  3:19 PM Windy KalataMichael, Taylor L, NT wrote: [facility] and [facility] because it is closer to her house. Please advise and contact patient if the switch it okay.  >> Sep 05, 2017  3:25 PM Marquis Buggyverman, Brittany A wrote: Are you ok with the patient coming to the office. I did not think we are suppose to send messages about this anymore if the doctor is open to New Patients?

## 2017-09-06 NOTE — Telephone Encounter (Signed)
Dr. Dayton MartesAron please advise, is this ok with you? Ok with Dr. Lawerance BachBurns.      Copied from CRM [phone]. Topic: Inquiry >> Sep 05, 2017  3:19 PM Windy KalataMichael, Taylor L, NT wrote: [facility] and would [facility] because it is closer to her house. Please advise and contact patient if the switch it okay.  >> Sep 05, 2017  3:25 PM Marquis Buggyverman, Brittany A wrote: Are you ok with the patient coming to the office. I did not think we are suppose to send messages about this anymore if the doctor is open to New Patients?

## 2017-09-07 NOTE — Telephone Encounter (Signed)
Patient is scheduled for 2/11

## 2017-09-21 ENCOUNTER — Other Ambulatory Visit: Payer: Self-pay

## 2017-09-24 ENCOUNTER — Ambulatory Visit (INDEPENDENT_AMBULATORY_CARE_PROVIDER_SITE_OTHER): Payer: BLUE CROSS/BLUE SHIELD | Admitting: Family Medicine

## 2017-09-24 ENCOUNTER — Encounter: Payer: Self-pay | Admitting: Family Medicine

## 2017-09-24 VITALS — BP 130/84 | HR 74 | Temp 98.7°F | Ht 63.5 in | Wt 154.6 lb

## 2017-09-24 DIAGNOSIS — Z01419 Encounter for gynecological examination (general) (routine) without abnormal findings: Secondary | ICD-10-CM

## 2017-09-24 DIAGNOSIS — Z0000 Encounter for general adult medical examination without abnormal findings: Secondary | ICD-10-CM | POA: Diagnosis not present

## 2017-09-24 DIAGNOSIS — E785 Hyperlipidemia, unspecified: Secondary | ICD-10-CM

## 2017-09-24 DIAGNOSIS — Z23 Encounter for immunization: Secondary | ICD-10-CM

## 2017-09-24 DIAGNOSIS — F4321 Adjustment disorder with depressed mood: Secondary | ICD-10-CM

## 2017-09-24 LAB — COMPREHENSIVE METABOLIC PANEL
ALK PHOS: 58 U/L (ref 39–117)
ALT: 20 U/L (ref 0–35)
AST: 19 U/L (ref 0–37)
Albumin: 4.3 g/dL (ref 3.5–5.2)
BILIRUBIN TOTAL: 1.6 mg/dL — AB (ref 0.2–1.2)
BUN: 13 mg/dL (ref 6–23)
CO2: 30 meq/L (ref 19–32)
CREATININE: 0.63 mg/dL (ref 0.40–1.20)
Calcium: 9.3 mg/dL (ref 8.4–10.5)
Chloride: 103 mEq/L (ref 96–112)
GFR: 102.23 mL/min (ref >60.00)
Glucose, Bld: 105 mg/dL — ABNORMAL HIGH (ref 70–99)
Potassium: 4.6 mEq/L (ref 3.5–5.1)
Sodium: 141 mEq/L (ref 135–145)
TOTAL PROTEIN: 7.1 g/dL (ref 6.0–8.3)

## 2017-09-24 LAB — LIPID PANEL
Cholesterol: 196 mg/dL (ref 0–200)
HDL: 53 mg/dL (ref >39.00)
LDL Cholesterol: 120 mg/dL — ABNORMAL HIGH (ref 0–99)
NONHDL: 142.9
Total CHOL/HDL Ratio: 4
Triglycerides: 115 mg/dL (ref 0.0–149.0)
VLDL: 23 mg/dL (ref 0.0–40.0)

## 2017-09-24 NOTE — Progress Notes (Signed)
Subjective:   Patient ID: Tiffany Vargas, female    DOB: 1955-10-16, 62 y.o.   MRN: [id number]  Tiffany Vargas is a pleasant 62 y.o. year old female who presents to clinic today with New Patient (Initial Visit) (Patient is here today as a  new patient to establish care with Dr. Dayton Martes. She did receive an abx for UTI Sx  by a Dr. on [facility].  I advised that if she has any Sx 10d after completion of abx to call us. Spouse passed 7 months ago and she receives grief counseling through Hospice.) and Annual Exam (She is here today for a CPE without PAP. She is not currently fasting.  She had Lipid panel, CBC, CMP in November.)  on 09/24/2017  HPI:  Health Maintenance  Topic Date Due  . COLONOSCOPY  01/07/2018  . MAMMOGRAM  06/09/2018  . PAP SMEAR  06/21/2021  . TETANUS/TDAP  09/25/2027  . INFLUENZA VACCINE  Completed  . Hepatitis C Screening  Completed  . HIV Screening  Completed   GYN- Dr. Audie Box, [place].  Unfortunately her husband passed away suddenly of an MI 7 months ago.  She feels she is coping the best she can.  Going to weekly support group at hospice and also meeting with a grief counselor one on one.  HLD- had been diet controlled but stopped watching what she was eating when her husband passed away.  She would like lipid panel rechecked today.  Lab Results  Component Value Date   CHOL 212 (H) 06/22/2017   HDL 59 06/22/2017   LDLCALC 107 (H) 08/02/2016   LDLDIRECT 109.8 02/20/2008   TRIG 88 06/22/2017   CHOLHDL 3.6 06/22/2017    Current Outpatient Medications on File Prior to Visit  Medication Sig Dispense Refill  . cefUROXime (CEFTIN) 250 MG tablet Take 1 tablet by mouth 2 (two) times daily.  0  . cetirizine (ZYRTEC) 10 MG tablet Take 10 mg by mouth daily.      . Multiple Vitamins-Minerals (MULTIVITAMIN PO) Take 1 tablet by mouth daily.      No current facility-administered medications on file prior to visit.     Allergies  Allergen Reactions  . Hydrocodone Nausea And  Vomiting and Other (See Comments)    Disoriented  . Other Diarrhea    Peanuts and Peanut butter    Past Medical History:  Diagnosis Date  . Cataract    OS; Dr Lisette Grinder , [facility]  . Elevated cholesterol   . Hypertension     Past Surgical History:  Procedure Laterality Date  . CATARACT EXTRACTION  06/20/2012   right/LEFT eye, lens implant , Dr Lisette Grinder , [facility]  . CESAREAN SECTION  1995  . COLONOSCOPY  2009   Negative  . DILATION AND CURETTAGE OF UTERUS  1999   Miscarriage  . DORSAL COMPARTMENT RELEASE Right 01/29/2014   Procedure: RIGHT  FIRST DORSAL COMPARTMENT RELEASE (DEQUERVAIN);  Surgeon: Wyn Forster, MD;  Location: [facility];  Service: Orthopedics;  Laterality: Right;  . LASIK  08/2003  . TONSILLECTOMY AND ADENOIDECTOMY  1964    Family History  Problem Relation Age of Onset  . Hypertension Mother   . Leukemia Mother        maternal family from [place]  . Diabetes Mother        diet & oral meds  . Dementia Mother 32  . Osteoporosis Mother   . Heart failure Father  CHF  . Cataracts Father   . Breast cancer Maternal Grandmother 59  . Cataracts Sister   . Cataracts Sister   . Leukemia Maternal Uncle   . Leukemia Maternal Grandfather   . Stroke Neg Hx     Social History   Socioeconomic History  . Marital status: Widowed    Spouse name: Not on file  . Number of children: Not on file  . Years of education: Not on file  . Highest education level: Not on file  Social Needs  . Financial resource strain: Not on file  . Food insecurity - worry: Not on file  . Food insecurity - inability: Not on file  . Transportation needs - medical: Not on file  . Transportation needs - non-medical: Not on file  Occupational History  . Not on file  Tobacco Use  . Smoking status: Never Smoker  . Smokeless tobacco: Never Used  Substance and Sexual Activity  . Alcohol use: Yes    Alcohol/week: 3.6 oz    Types: 6 Glasses of wine per week    Comment:  occasional  wine or liquor  . Drug use: No  . Sexual activity: No    Birth control/protection: None    Comment: Pt declined sexual hx questions  Other Topics Concern  . Not on file  Social History Narrative   Exercise; walking      accountant   The PMH, PSH, Social History, Family History, Medications, and allergies have been reviewed in [facility], and have been updated if relevant.   Review of Systems  Constitutional: Negative.   HENT: Negative.   Respiratory: Negative.   Cardiovascular: Negative.   Gastrointestinal: Negative.   Endocrine: Negative.   Genitourinary: Negative.   Musculoskeletal: Negative.   Allergic/Immunologic: Negative.   Neurological: Negative.   Hematological: Negative.   Psychiatric/Behavioral: Negative.   All other systems reviewed and are negative.      Objective:    BP 130/84 (BP Location: Left Arm, Patient Position: Sitting, Cuff Size: Normal)   Pulse 74   Temp 98.7 F (37.1 C) (Oral)   Ht 5' 3.5" (1.613 m)   Wt 154 lb 9.6 oz (70.1 kg)   LMP 03/06/2014   SpO2 97%   BMI 26.96 kg/m    Physical Exam   General:  Well-developed,well-nourished,in no acute distress; alert,appropriate and cooperative throughout examination Head:  normocephalic and atraumatic.   Eyes:  vision grossly intact, PERRL Ears:  R ear normal and L ear normal externally, TMs clear bilaterally Nose:  no external deformity.   Mouth:  good dentition.   Neck:  No deformities, masses, or tenderness noted. Lungs:  Normal respiratory effort, chest expands symmetrically. Lungs are clear to auscultation, no crackles or wheezes. Heart:  Normal rate and regular rhythm. S1 and S2 normal without gallop, murmur, click, rub or other extra sounds. Abdomen:  Bowel sounds positive,abdomen soft and non-tender without masses, organomegaly or hernias noted. Msk:  No deformity or scoliosis noted of thoracic or lumbar spine.   Extremities:  No clubbing, cyanosis, edema, or deformity noted with  normal full range of motion of all joints.   Neurologic:  alert & oriented X3 and gait normal.   Skin:  Intact without suspicious lesions or rashes Cervical Nodes:  No lymphadenopathy noted Axillary Nodes:  No palpable lymphadenopathy Psych:  Cognition and judgment appear intact. Alert and cooperative with normal attention span and concentration. No apparent delusions, illusions, hallucinations       Assessment & Plan:  Well woman exam - Plan: Comprehensive metabolic panel, Lipid panel  Need for Tdap vaccination - Plan: Tdap vaccine greater than or equal to 7yo IM  Hyperlipidemia, unspecified hyperlipidemia type No Follow-up on file.

## 2017-09-24 NOTE — Patient Instructions (Addendum)
Great to meet you.  Please call your insurance company to inquire coverage about: 1.  Cologuard 2. Shingrix- ask if they will pay for in office or at pharmacy.  I will call you with your lab results from today and you can view them online.   Please try topical lamisil and keep me updated.

## 2017-09-24 NOTE — Assessment & Plan Note (Signed)
Appears to be coping well, continue hospice grief counseling.

## 2017-09-24 NOTE — Assessment & Plan Note (Signed)
Reviewed preventive care protocols, scheduled due services, and updated immunizations Discussed nutrition, exercise, diet, and healthy lifestyle.  I did advise her to call insurance company about both cologuard coverage and shingrix.

## 2017-09-24 NOTE — Assessment & Plan Note (Signed)
Repeat lipid panel and CMET today. Previously diet controlled.

## 2017-09-27 ENCOUNTER — Encounter: Payer: Self-pay | Admitting: Family Medicine

## 2017-10-02 ENCOUNTER — Telehealth: Payer: Self-pay

## 2017-10-02 NOTE — Telephone Encounter (Signed)
Cologuard order faxed per pt req/states that ins covers at 100%/pt also verified that Shingrix is covered at 100%/per Dr. Dayton MartesAron ok to sched nurse visit/sent pt MyChart message stating that we could sched this for her/thx dmf

## 2017-10-04 ENCOUNTER — Ambulatory Visit: Payer: Self-pay

## 2017-10-09 ENCOUNTER — Ambulatory Visit (INDEPENDENT_AMBULATORY_CARE_PROVIDER_SITE_OTHER): Payer: BLUE CROSS/BLUE SHIELD | Admitting: Behavioral Health

## 2017-10-09 DIAGNOSIS — Z23 Encounter for immunization: Secondary | ICD-10-CM

## 2017-10-09 NOTE — Progress Notes (Signed)
Patient came in clinic for Shingrix vaccination per OV note 09/24/17. IM injection was given in the left deltoid. Patient tolerated the injection well. No s/s of a reaction prior to patient leaving the nurse visit. Next appointment 12/11/17 for 2nd Shingrix vaccination.

## 2017-10-11 ENCOUNTER — Encounter: Payer: Self-pay | Admitting: Family Medicine

## 2017-10-11 DIAGNOSIS — Z1212 Encounter for screening for malignant neoplasm of rectum: Secondary | ICD-10-CM | POA: Diagnosis not present

## 2017-10-11 DIAGNOSIS — Z1211 Encounter for screening for malignant neoplasm of colon: Secondary | ICD-10-CM | POA: Diagnosis not present

## 2017-10-12 ENCOUNTER — Encounter: Payer: Self-pay | Admitting: Nurse Practitioner

## 2017-10-12 ENCOUNTER — Ambulatory Visit: Payer: BLUE CROSS/BLUE SHIELD | Admitting: Nurse Practitioner

## 2017-10-12 VITALS — BP 124/76 | HR 65 | Temp 98.4°F | Ht 63.5 in | Wt 157.0 lb

## 2017-10-12 DIAGNOSIS — M545 Low back pain, unspecified: Secondary | ICD-10-CM

## 2017-10-12 DIAGNOSIS — R350 Frequency of micturition: Secondary | ICD-10-CM | POA: Diagnosis not present

## 2017-10-12 LAB — POCT URINALYSIS DIPSTICK
Bilirubin, UA: NEGATIVE
GLUCOSE UA: NEGATIVE
Ketones, UA: NEGATIVE
Leukocytes, UA: NEGATIVE
Nitrite, UA: NEGATIVE
Protein, UA: NEGATIVE
RBC UA: NEGATIVE
Spec Grav, UA: 1.01 (ref 1.010–1.025)
Urobilinogen, UA: 0.2 E.U./dL
pH, UA: 7 (ref 5.0–8.0)

## 2017-10-12 MED ORDER — PHENAZOPYRIDINE HCL 100 MG PO TABS: 100.0000 mg | ORAL_TABLET | Freq: Three times a day (TID) | ORAL | 0 refills | Status: DC | PRN

## 2017-10-12 MED ORDER — NITROFURANTOIN MONOHYD MACRO 100 MG PO CAPS: 100.0000 mg | ORAL_CAPSULE | Freq: Two times a day (BID) | ORAL | 0 refills | Status: AC

## 2017-10-12 NOTE — Progress Notes (Signed)
Subjective:  Patient ID: [name], female    DOB: 1956-05-21  Age: 62 y.o. MRN: [id number]  CC: Urinary Tract Infection (frequent urinate,lower back pain had 1 two week ago/)   Urinary Tract Infection   This is a recurrent problem. The current episode started 1 to 4 weeks ago. The problem occurs every urination. The problem has been gradually worsening. The quality of the pain is described as aching. There has been no fever. She is sexually active. There is no history of pyelonephritis. Associated symptoms include frequency. Pertinent negatives include no chills, discharge, flank pain, hematuria, hesitancy, nausea, possible pregnancy, sweats, urgency or vomiting. Associated symptoms comments: Bilateral lower back pain.. She has tried increased fluids and antibiotics for the symptoms. There is no history of recurrent UTIs or urinary stasis.  treated for UTI 2weeks ago with cefuroxime.  Outpatient Medications Prior to Visit  Medication Sig Dispense Refill  . cetirizine (ZYRTEC) 10 MG tablet Take 10 mg by mouth daily.      . Multiple Vitamins-Minerals (MULTIVITAMIN PO) Take 1 tablet by mouth daily.      No facility-administered medications prior to visit.     ROS See HPI  Objective:  BP 124/76   Pulse 65   Temp 98.4 F (36.9 C)   Ht 5' 3.5" (1.613 m)   Wt 157 lb (71.2 kg)   LMP 03/06/2014   SpO2 97%   BMI 27.38 kg/m   BP Readings from Last 3 Encounters:  10/12/17 124/76  09/24/17 130/84  06/22/17 120/78    Wt Readings from Last 3 Encounters:  10/12/17 157 lb (71.2 kg)  09/24/17 154 lb 9.6 oz (70.1 kg)  06/22/17 148 lb (67.1 kg)    Physical Exam  Constitutional: She is oriented to person, place, and time. No distress.  Cardiovascular: Normal rate.  Pulmonary/Chest: Effort normal.  Abdominal: Soft. She exhibits no distension. There is tenderness. There is no rebound and no guarding.  Suprapubic tenderness  Neurological: She is alert and oriented to person, place,  and time.  Skin: Skin is warm and dry.  Vitals reviewed.   Lab Results  Component Value Date   WBC 4.5 06/22/2017   HGB 14.9 06/22/2017   HCT 44.2 06/22/2017   PLT 206 06/22/2017   GLUCOSE 105 (H) 09/24/2017   CHOL 196 09/24/2017   TRIG 115.0 09/24/2017   HDL 53.00 09/24/2017   LDLDIRECT 109.8 02/20/2008   LDLCALC 120 (H) 09/24/2017   ALT 20 09/24/2017   AST 19 09/24/2017   NA 141 09/24/2017   K 4.6 09/24/2017   CL 103 09/24/2017   CREATININE 0.63 09/24/2017   BUN 13 09/24/2017   CO2 30 09/24/2017   TSH 1.35 08/02/2016   HGBA1C 5.5 02/03/2016    Mr Cervical Spine Wo Contrast  Result Date: 03/17/2016 CLINICAL DATA:  Pain and tingling in the lower right arm since February 2017 EXAM: MRI CERVICAL SPINE WITHOUT CONTRAST TECHNIQUE: Multiplanar, multisequence MR imaging of the cervical spine was performed. No intravenous contrast was administered. COMPARISON:  None. FINDINGS: Alignment: Slight anterolisthesis at C3-4 and C4-5, facet mediated. Vertebrae: No fracture, evidence of discitis, or bone lesion. Marrow edema at the left C4-5 facet and right C5-6 facet. There is a posterior synovial cyst from the C5-6 facet measuring 5 mm, without signs of acute soft tissue inflammation. Intraosseous or paraspinal cystic change at the left C4-5 facet. Cord: Normal signal and morphology. Posterior Fossa, vertebral arteries, paraspinal tissues: Negative Disc levels: C2-3: Facet spurring greater  on the left. No herniation or impingement C3-4: Severe facet arthropathy on the left with bulky spurring. Moderate spurring on the right. Left predominant uncovertebral ridging. Open right foramen. Moderate to advanced left foraminal stenosis. C4-5: Severe facet arthropathy on the left with inflammatory features ascribed above. Uncovertebral spurring on the left. Small central disc protrusion. Advanced left foraminal stenosis. C5-6: Disc narrowing with bulky uncovertebral spurring. Facet arthropathy with spurring.  Ligamentum flavum thickening. Early canal stenosis with incomplete CSF effacement. Bilateral foraminal stenosis with potential impingement. Stenosis on the right is particularly severe. C6-7: Disc bulging. Degenerative facet spurring. No stenosis or impingement C7-T1:Unremarkable. IMPRESSION: 1. Symptomatic level for a right radiculopathy is presumably C5-6 where there is severe right foraminal stenosis from uncovertebral and facet spurring. 2. Left foraminal stenosis with impingement at C3-4, C4-5, and C5-6. 3. Potentially painful marrow edema associated with right C5-6 and left C4-5 facet arthropathy. Electronically Signed   By: Marnee Spring M.D.   On: 03/17/2016 11:13    Assessment & Plan:   [name] was seen today for urinary tract infection.  Diagnoses and all orders for this visit:  Frequent urination -     POCT urinalysis dipstick -     Urine Culture -     nitrofurantoin, macrocrystal-monohydrate, (MACROBID) 100 MG capsule; Take 1 capsule (100 mg total) by mouth 2 (two) times daily for 5 days. -     phenazopyridine (PYRIDIUM) 100 MG tablet; Take 1 tablet (100 mg total) by mouth 3 (three) times daily as needed for pain (with food).  Acute bilateral low back pain without sciatica -     nitrofurantoin, macrocrystal-monohydrate, (MACROBID) 100 MG capsule; Take 1 capsule (100 mg total) by mouth 2 (two) times daily for 5 days. -     phenazopyridine (PYRIDIUM) 100 MG tablet; Take 1 tablet (100 mg total) by mouth 3 (three) times daily as needed for pain (with food).   I am having [name] start on nitrofurantoin (macrocrystal-monohydrate) and phenazopyridine. I am also having her maintain her cetirizine and Multiple Vitamins-Minerals (MULTIVITAMIN PO).  Meds ordered this encounter  Medications  . nitrofurantoin, macrocrystal-monohydrate, (MACROBID) 100 MG capsule    Sig: Take 1 capsule (100 mg total) by mouth 2 (two) times daily for 5 days.    Dispense:  10 capsule    Refill:  0     Order Specific Question:   Supervising Provider    Answer:   Dianne Dun [[id number]]  . phenazopyridine (PYRIDIUM) 100 MG tablet    Sig: Take 1 tablet (100 mg total) by mouth 3 (three) times daily as needed for pain (with food).    Dispense:  10 tablet    Refill:  0    Order Specific Question:   Supervising Provider    Answer:   Dianne Dun [[id number]]    Follow-up: No Follow-up on file.  Alysia Penna, NP

## 2017-10-12 NOTE — Patient Instructions (Addendum)
You will be contacted with urine culture results  Acute Urinary Retention, Female Urinary retention means you are unable to pee completely or at all (empty your bladder). Follow these instructions at home:  Drink enough fluids to keep your pee (urine) clear or pale yellow.  If you are sent home with a tube that drains the bladder (catheter), there will be a drainage bag attached to it. There are two types of bags. One is big that you can wear at night without having to empty it. One is smaller and needs to be emptied more often. ? Keep the drainage bag emptied. ? Keep the drainage bag lower than the tube.  Only take medicine as told by your doctor. Contact a doctor if:  You have a low-grade fever.  You have spasms or you are leaking pee when you have spasms. Get help right away if:  You have chills or a fever.  Your catheter stops draining pee.  Your catheter falls out.  You have increased bleeding that does not stop after you have rested and increased the amount of fluids you had been drinking. This information is not intended to replace advice given to you by your health care provider. Make sure you discuss any questions you have with your health care provider. Document Released: 01/17/2008 Document Revised: 01/06/2016 Document Reviewed: 01/09/2013 Elsevier Interactive Patient Education  2017 [employer]

## 2017-10-13 LAB — URINE CULTURE
MICRO NUMBER: 90268366
SPECIMEN QUALITY:: ADEQUATE

## 2017-10-19 LAB — COLOGUARD

## 2017-10-22 ENCOUNTER — Telehealth: Payer: Self-pay

## 2017-10-22 NOTE — Telephone Encounter (Signed)
Pt aware of Cologuard results WNL/thx dmf

## 2017-11-05 DIAGNOSIS — L258 Unspecified contact dermatitis due to other agents: Secondary | ICD-10-CM | POA: Diagnosis not present

## 2017-12-11 ENCOUNTER — Ambulatory Visit (INDEPENDENT_AMBULATORY_CARE_PROVIDER_SITE_OTHER): Payer: BLUE CROSS/BLUE SHIELD

## 2017-12-11 DIAGNOSIS — Z23 Encounter for immunization: Secondary | ICD-10-CM

## 2017-12-11 NOTE — Progress Notes (Signed)
Pt came in for second shingrix vaccine. IM injection given left deltoid. Patient tolerated the injection well. No s/s of reaction. TLG

## 2017-12-13 ENCOUNTER — Ambulatory Visit: Payer: Self-pay | Admitting: Family Medicine

## 2018-04-22 DIAGNOSIS — D225 Melanocytic nevi of trunk: Secondary | ICD-10-CM | POA: Diagnosis not present

## 2018-04-22 DIAGNOSIS — L821 Other seborrheic keratosis: Secondary | ICD-10-CM | POA: Diagnosis not present

## 2018-04-24 DIAGNOSIS — H1013 Acute atopic conjunctivitis, bilateral: Secondary | ICD-10-CM | POA: Diagnosis not present

## 2018-06-14 ENCOUNTER — Encounter: Payer: Self-pay | Admitting: Family Medicine

## 2018-06-14 DIAGNOSIS — Z1231 Encounter for screening mammogram for malignant neoplasm of breast: Secondary | ICD-10-CM | POA: Diagnosis not present

## 2018-06-14 DIAGNOSIS — Z803 Family history of malignant neoplasm of breast: Secondary | ICD-10-CM | POA: Diagnosis not present

## 2018-06-25 ENCOUNTER — Encounter: Payer: Self-pay | Admitting: Gynecology

## 2018-06-25 ENCOUNTER — Ambulatory Visit (INDEPENDENT_AMBULATORY_CARE_PROVIDER_SITE_OTHER): Payer: BLUE CROSS/BLUE SHIELD | Admitting: Gynecology

## 2018-06-25 VITALS — BP 122/74 | Ht 63.0 in | Wt 160.0 lb

## 2018-06-25 DIAGNOSIS — N9089 Other specified noninflammatory disorders of vulva and perineum: Secondary | ICD-10-CM | POA: Diagnosis not present

## 2018-06-25 DIAGNOSIS — Z01419 Encounter for gynecological examination (general) (routine) without abnormal findings: Secondary | ICD-10-CM | POA: Diagnosis not present

## 2018-06-25 DIAGNOSIS — N952 Postmenopausal atrophic vaginitis: Secondary | ICD-10-CM | POA: Diagnosis not present

## 2018-06-25 NOTE — Progress Notes (Signed)
    Harlen LabsRuth E Metzger 08/28/1955 [id number]        62 y.o.  G2P1011 for annual gynecologic exam.  Without gynecologic complaints  Past medical history,surgical history, problem list, medications, allergies, family history and social history were all reviewed and documented as reviewed in the EPIC chart.  ROS:  Performed with pertinent positives and negatives included in the history, assessment and plan.   Additional significant findings : None   Exam: Kennon PortelaKim Gardner assistant Vitals:   06/25/18 0908  BP: 122/74  Weight: 160 lb (72.6 kg)  Height: 5\' 3"  (1.6 m)   Body mass index is 28.34 kg/m.  General appearance:  Normal affect, orientation and appearance. Skin: Grossly normal HEENT: Without gross lesions.  No cervical or supraclavicular adenopathy. Thyroid normal.  Lungs:  Clear without wheezing, rales or rhonchi Cardiac: RR, without RMG Abdominal:  Soft, nontender, without masses, guarding, rebound, organomegaly or hernia Breasts:  Examined lying and sitting without masses, retractions, discharge or axillary adenopathy. Pelvic:  Ext, BUS, Vagina: With atrophic changes.  Small classic skin tag mid left labia majora  Cervix: With atrophic changes  Uterus: Anteverted, normal size, shape and contour, midline and mobile nontender   Adnexa: Without masses or tenderness    Anus and perineum: Normal   Rectovaginal: Normal sphincter tone without palpated masses or tenderness.    Assessment/Plan:  62 y.o. 742P1011 female for annual gynecologic exam.   1. Postmenopausal/atrophic genital changes.  No significant menopausal symptoms or any bleeding. 2. Skin tag left labia majora.  Stable over years observation.  Not bothersome to the patient.  Continue to follow and report any changes. 3. Colonoscopy 2009.  Cologuard 2019.  We will continue to follow-up with her primary physician for colon screening. 4. Mammography 06/2018.  Continue with annual mammography next year.  Breast exam normal  today. 5. DEXA 2017 normal.  Repeat at 5-year interval. 6. Pap smear 06/2016.  No Pap smear done today.  Plan repeat Pap smear next year at 3-year interval per current screening guidelines.  No history of abnormal Pap smears previously. 7. Health maintenance.  Patient reports routine lab work done through her primary physician's office.  Follow-up 1 year, sooner as needed.   Dara Lordsimothy P Fontaine MD, 9:41 AM 06/25/2018

## 2018-06-25 NOTE — Patient Instructions (Signed)
Follow-up in 1 year when due for annual exam, sooner if any issues.

## 2018-10-15 NOTE — Progress Notes (Addendum)
Subjective:   Patient ID: Tiffany Vargas, female    DOB: January 30, 1956, 63 y.o.   MRN: [id number]  Tiffany Vargas is a pleasant 63 y.o. year old female who presents to clinic today with Annual Exam (Patient is here today for a CPE.  She last saw her GYN Dr. Phineas Real on 11.12.19.  She is currently fasting today.Marland Kitchen)  on 10/16/2018  HPI:  Health Maintenance  Topic Date Due  . MAMMOGRAM  06/14/2020  . Fecal DNA (Cologuard)  10/11/2020  . PAP SMEAR-Modifier  06/21/2021  . TETANUS/TDAP  09/25/2027  . INFLUENZA VACCINE  Completed  . Hepatitis C Screening  Completed  . HIV Screening  Completed  Negative cologuard 10/11/17.  GYN- Dr. Phineas Real, UTD.  Just saw him on 06/25/18. Mammogram 07/09/18  Unfortunately her husband passed away suddenly of an MI last year. She feels she is coping the best she can, no longer attending hospice counseling.  Right hand pain- localized at base of 4th digit. Feels like finger gets stuck at times.  Getting worse over past couple of weeks.  Gripping seems to make it worse.  Denies decreased grip strength other than due to pain.  HLD- had been diet controlled but stopped watching what she was eating when her husband passed away.    Lab Results  Component Value Date   CHOL 196 09/24/2017   HDL 53.00 09/24/2017   LDLCALC 120 (H) 09/24/2017   LDLDIRECT 109.8 02/20/2008   TRIG 115.0 09/24/2017   CHOLHDL 4 09/24/2017    Current Outpatient Medications on File Prior to Visit  Medication Sig Dispense Refill  . cetirizine (ZYRTEC) 10 MG tablet Take 10 mg by mouth daily.      . Multiple Vitamins-Minerals (MULTIVITAMIN PO) Take 1 tablet by mouth daily.      No current facility-administered medications on file prior to visit.     Allergies  Allergen Reactions  . Hydrocodone Nausea And Vomiting and Other (See Comments)    Disoriented  . Other Diarrhea    Peanuts and Peanut butter    Past Medical History:  Diagnosis Date  . Cataract    OS; Dr Isaiah Blakes , [facility]  .  Elevated cholesterol   . Hypertension     Past Surgical History:  Procedure Laterality Date  . CATARACT EXTRACTION  06/20/2012   right/LEFT eye, lens implant , Dr Isaiah Blakes , [facility]  . CESAREAN SECTION  1995  . COLONOSCOPY  2009   Negative  . DILATION AND CURETTAGE OF UTERUS  1999   Miscarriage  . DORSAL COMPARTMENT RELEASE Right 01/29/2014   Procedure: RIGHT  FIRST DORSAL COMPARTMENT RELEASE (DEQUERVAIN);  Surgeon: Cammie Sickle, MD;  Location: [facility];  Service: Orthopedics;  Laterality: Right;  . LASIK  08/2003  . TONSILLECTOMY AND ADENOIDECTOMY  1964    Family History  Problem Relation Age of Onset  . Hypertension Mother   . Leukemia Mother        maternal family from [place]  . Diabetes Mother        diet & oral meds  . Dementia Mother 59  . Osteoporosis Mother   . Heart failure Father        CHF  . Cataracts Father   . Breast cancer Maternal Grandmother 76  . Cataracts Sister   . Cataracts Sister   . Leukemia Maternal Uncle   . Leukemia Maternal Grandfather   . Stroke Neg Hx     Social History  Socioeconomic History  . Marital status: Widowed    Spouse name: Not on file  . Number of children: Not on file  . Years of education: Not on file  . Highest education level: Not on file  Occupational History  . Not on file  Social Needs  . Financial resource strain: Not on file  . Food insecurity:    Worry: Not on file    Inability: Not on file  . Transportation needs:    Medical: Not on file    Non-medical: Not on file  Tobacco Use  . Smoking status: Never Smoker  . Smokeless tobacco: Never Used  Substance and Sexual Activity  . Alcohol use: Yes    Alcohol/week: 6.0 standard drinks    Types: 6 Glasses of wine per week    Comment: occasional  wine or liquor  . Drug use: No  . Sexual activity: Never    Birth control/protection: None    Comment: Pt declined sexual hx questions  Lifestyle  . Physical activity:    Days per week: Not on  file    Minutes per session: Not on file  . Stress: Not on file  Relationships  . Social connections:    Talks on phone: Not on file    Gets together: Not on file    Attends religious service: Not on file    Active member of club or organization: Not on file    Attends meetings of clubs or organizations: Not on file    Relationship status: Not on file  . Intimate partner violence:    Fear of current or ex partner: Not on file    Emotionally abused: Not on file    Physically abused: Not on file    Forced sexual activity: Not on file  Other Topics Concern  . Not on file  Social History Narrative   Exercise; walking      accountant   The PMH, PSH, Social History, Family History, Medications, and allergies have been reviewed in [facility], and have been updated if relevant.   Review of Systems  Constitutional: Positive for fatigue.  HENT: Negative.   Respiratory: Negative.   Cardiovascular: Negative.   Gastrointestinal: Negative.   Endocrine: Negative.   Genitourinary: Negative.   Musculoskeletal: Positive for arthralgias.  Allergic/Immunologic: Negative.   Neurological: Negative.   Hematological: Negative.   Psychiatric/Behavioral: Negative.   All other systems reviewed and are negative.      Objective:    BP 132/84 (BP Location: Left Arm, Patient Position: Sitting, Cuff Size: Normal)   Pulse 62   Temp 98.2 F (36.8 C) (Oral)   Ht _0  (1.6 m)   Wt 159 lb 6.4 oz (72.3 kg)   LMP 03/06/2014   SpO2 98%   BMI 28.24 kg/m   Wt Readings from Last 3 Encounters:  10/16/18 159 lb 6.4 oz (72.3 kg)  06/25/18 160 lb (72.6 kg)  10/12/17 157 lb (71.2 kg)    Physical Exam   General:  Well-developed,well-nourished,in no acute distress; alert,appropriate and cooperative throughout examination Head:  normocephalic and atraumatic.   Eyes:  vision grossly intact, PERRL Ears:  R ear normal and L ear normal externally, TMs clear bilaterally Nose:  no external deformity.   Mouth:   good dentition.   Neck:  No deformities, masses, or tenderness noted. Lungs:  Normal respiratory effort, chest expands symmetrically. Lungs are clear to auscultation, no crackles or wheezes. Heart:  Normal rate and regular rhythm. S1 and S2 normal  without gallop, murmur, click, rub or other extra sounds. Abdomen:  Bowel sounds positive,abdomen soft and non-tender without masses, organomegaly or hernias noted. Msk:  No deformity or scoliosis noted of thoracic or lumbar spine.   Extremities:  Right hand- base of 4th metacarpal- thickening of palmar fascia, signs of slipping tendon. Neurologic:  alert & oriented X3 and gait normal.   Skin:  Intact without suspicious lesions or rashes Cervical Nodes:  No lymphadenopathy noted Axillary Nodes:  No palpable lymphadenopathy Psych:  Cognition and judgment appear intact. Alert and cooperative with normal attention span and concentration. No apparent delusions, illusions, hallucinations       Assessment & Plan:   Well woman exam - Plan: Comp Met (CMET), CBC w/Diff, TSH, Lipid Profile, VITAMIN D 25 Hydroxy (Vit-D Deficiency, Fractures)  Hyperlipidemia, unspecified hyperlipidemia type - Plan: Comp Met (CMET), CBC w/Diff, TSH, Lipid Profile  Essential hypertension - Plan: Comp Met (CMET), CBC w/Diff, TSH, Lipid Profile  Grief - Plan: Comp Met (CMET), CBC w/Diff, TSH No follow-ups on file.

## 2018-10-16 ENCOUNTER — Encounter: Payer: Self-pay | Admitting: Family Medicine

## 2018-10-16 ENCOUNTER — Ambulatory Visit (INDEPENDENT_AMBULATORY_CARE_PROVIDER_SITE_OTHER): Payer: BLUE CROSS/BLUE SHIELD | Admitting: Family Medicine

## 2018-10-16 VITALS — BP 132/84 | HR 62 | Temp 98.2°F | Ht 63.0 in | Wt 159.4 lb

## 2018-10-16 DIAGNOSIS — F4321 Adjustment disorder with depressed mood: Secondary | ICD-10-CM

## 2018-10-16 DIAGNOSIS — M65341 Trigger finger, right ring finger: Secondary | ICD-10-CM | POA: Diagnosis not present

## 2018-10-16 DIAGNOSIS — M24541 Contracture, right hand: Secondary | ICD-10-CM

## 2018-10-16 DIAGNOSIS — Z01419 Encounter for gynecological examination (general) (routine) without abnormal findings: Secondary | ICD-10-CM

## 2018-10-16 DIAGNOSIS — I10 Essential (primary) hypertension: Secondary | ICD-10-CM

## 2018-10-16 DIAGNOSIS — E785 Hyperlipidemia, unspecified: Secondary | ICD-10-CM

## 2018-10-16 LAB — CBC WITH DIFFERENTIAL/PLATELET
BASOS PCT: 0.9 % (ref 0.0–3.0)
Basophils Absolute: 0 10*3/uL (ref 0.0–0.1)
EOS PCT: 2.1 % (ref 0.0–5.0)
Eosinophils Absolute: 0.1 10*3/uL (ref 0.0–0.7)
HEMATOCRIT: 45.3 % (ref 36.0–46.0)
HEMOGLOBIN: 15.1 g/dL — AB (ref 12.0–15.0)
Lymphocytes Relative: 44.3 % (ref 12.0–46.0)
Lymphs Abs: 2.3 10*3/uL (ref 0.7–4.0)
MCHC: 33.3 g/dL (ref 30.0–36.0)
MCV: 89.2 fl (ref 78.0–100.0)
Monocytes Absolute: 0.4 10*3/uL (ref 0.1–1.0)
Monocytes Relative: 7.2 % (ref 3.0–12.0)
Neutro Abs: 2.4 10*3/uL (ref 1.4–7.7)
Neutrophils Relative %: 45.5 % (ref 43.0–77.0)
Platelets: 205 10*3/uL (ref 150.0–400.0)
RBC: 5.08 Mil/uL (ref 3.87–5.11)
RDW: 13.6 % (ref 11.5–15.5)
WBC: 5.2 10*3/uL (ref 4.0–10.5)

## 2018-10-16 LAB — COMPREHENSIVE METABOLIC PANEL
ALT: 18 U/L (ref 0–35)
AST: 19 U/L (ref 0–37)
Albumin: 4.6 g/dL (ref 3.5–5.2)
Alkaline Phosphatase: 59 U/L (ref 39–117)
BUN: 14 mg/dL (ref 6–23)
CHLORIDE: 104 meq/L (ref 96–112)
CO2: 30 mEq/L (ref 19–32)
CREATININE: 0.69 mg/dL (ref 0.40–1.20)
Calcium: 9.8 mg/dL (ref 8.4–10.5)
GFR: 86.29 mL/min (ref >60.00)
GLUCOSE: 93 mg/dL (ref 70–99)
POTASSIUM: 4.6 meq/L (ref 3.5–5.1)
Sodium: 141 mEq/L (ref 135–145)
TOTAL PROTEIN: 7.2 g/dL (ref 6.0–8.3)
Total Bilirubin: 1.7 mg/dL — ABNORMAL HIGH (ref 0.2–1.2)

## 2018-10-16 LAB — LIPID PANEL
CHOL/HDL RATIO: 4
CHOLESTEROL: 214 mg/dL — AB (ref 0–200)
HDL: 51.8 mg/dL (ref >39.00)
LDL CALC: 136 mg/dL — AB (ref 0–99)
NonHDL: 162.64
TRIGLYCERIDES: 134 mg/dL (ref 0.0–149.0)
VLDL: 26.8 mg/dL (ref 0.0–40.0)

## 2018-10-16 LAB — TSH: TSH: 1.19 u[IU]/mL (ref 0.35–4.50)

## 2018-10-16 LAB — VITAMIN D 25 HYDROXY (VIT D DEFICIENCY, FRACTURES): VITD: 34.01 ng/mL (ref 30.00–100.00)

## 2018-10-16 NOTE — Patient Instructions (Signed)
Great to see you. I will call you with your lab results from today and you can view them online.   

## 2018-10-16 NOTE — Assessment & Plan Note (Addendum)
With ? dupuytren contracture. Refer to hand surgeon, Dr. Amanda Pea. The patient indicates understanding of these issues and agrees with the plan.

## 2018-10-16 NOTE — Assessment & Plan Note (Signed)
Reviewed preventive care protocols, scheduled due services, and updated immunizations Discussed nutrition, exercise, diet, and healthy lifestyle.  

## 2018-10-16 NOTE — Assessment & Plan Note (Signed)
Diet controlled. Due for labs today. 

## 2018-10-16 NOTE — Addendum Note (Signed)
Addended by: Dianne Dun on: 10/16/2018 11:41 AM   Modules accepted: Orders

## 2018-10-16 NOTE — Assessment & Plan Note (Signed)
Feels she is coping well.

## 2018-10-16 NOTE — Assessment & Plan Note (Signed)
Well controlled without rx. 

## 2018-10-18 ENCOUNTER — Encounter: Payer: Self-pay | Admitting: Family Medicine

## 2018-11-06 DIAGNOSIS — M65341 Trigger finger, right ring finger: Secondary | ICD-10-CM | POA: Diagnosis not present

## 2018-11-06 DIAGNOSIS — R229 Localized swelling, mass and lump, unspecified: Secondary | ICD-10-CM | POA: Diagnosis not present

## 2018-11-06 DIAGNOSIS — R2232 Localized swelling, mass and lump, left upper limb: Secondary | ICD-10-CM | POA: Diagnosis not present

## 2018-11-08 ENCOUNTER — Other Ambulatory Visit: Payer: Self-pay | Admitting: Orthopedic Surgery

## 2018-11-08 DIAGNOSIS — IMO0002 Reserved for concepts with insufficient information to code with codable children: Secondary | ICD-10-CM

## 2018-11-08 DIAGNOSIS — R229 Localized swelling, mass and lump, unspecified: Principal | ICD-10-CM

## 2019-02-13 ENCOUNTER — Ambulatory Visit
Admission: RE | Admit: 2019-02-13 | Discharge: 2019-02-13 | Disposition: A | Discharge status: Home / Self Care | Payer: BC Managed Care – PPO | Source: Ambulatory Visit | Attending: Orthopedic Surgery | Admitting: Orthopedic Surgery

## 2019-02-13 ENCOUNTER — Other Ambulatory Visit: Payer: Self-pay | Admitting: Orthopedic Surgery

## 2019-02-13 DIAGNOSIS — IMO0002 Reserved for concepts with insufficient information to code with codable children: Secondary | ICD-10-CM

## 2019-02-13 DIAGNOSIS — R2232 Localized swelling, mass and lump, left upper limb: Secondary | ICD-10-CM | POA: Diagnosis not present

## 2019-02-19 DIAGNOSIS — M65341 Trigger finger, right ring finger: Secondary | ICD-10-CM | POA: Diagnosis not present

## 2019-05-13 ENCOUNTER — Encounter: Payer: Self-pay | Admitting: Gynecology

## 2019-05-13 DIAGNOSIS — H5213 Myopia, bilateral: Secondary | ICD-10-CM | POA: Diagnosis not present

## 2019-05-13 DIAGNOSIS — Z961 Presence of intraocular lens: Secondary | ICD-10-CM | POA: Diagnosis not present

## 2019-05-13 DIAGNOSIS — H10413 Chronic giant papillary conjunctivitis, bilateral: Secondary | ICD-10-CM | POA: Diagnosis not present

## 2019-06-16 ENCOUNTER — Encounter: Payer: Self-pay | Admitting: Gynecology

## 2019-06-16 DIAGNOSIS — Z1231 Encounter for screening mammogram for malignant neoplasm of breast: Secondary | ICD-10-CM | POA: Diagnosis not present

## 2019-06-16 DIAGNOSIS — Z803 Family history of malignant neoplasm of breast: Secondary | ICD-10-CM | POA: Diagnosis not present

## 2019-06-20 ENCOUNTER — Encounter: Payer: Self-pay | Admitting: Family Medicine

## 2019-06-20 ENCOUNTER — Other Ambulatory Visit: Payer: Self-pay

## 2019-06-20 ENCOUNTER — Ambulatory Visit: Payer: BC Managed Care – PPO | Admitting: Family Medicine

## 2019-06-20 DIAGNOSIS — L239 Allergic contact dermatitis, unspecified cause: Secondary | ICD-10-CM | POA: Diagnosis not present

## 2019-06-20 DIAGNOSIS — L259 Unspecified contact dermatitis, unspecified cause: Secondary | ICD-10-CM | POA: Insufficient documentation

## 2019-06-20 MED ORDER — TRIAMCINOLONE ACETONIDE 0.1 % EX CREA: 1.0000 "application " | TOPICAL_CREAM | Freq: Two times a day (BID) | CUTANEOUS | 0 refills | Status: DC

## 2019-06-20 NOTE — Progress Notes (Signed)
Tiffany Vargas - 63 y.o. female MRN [id number]  Date of birth: July 16, 1956  Subjective Chief Complaint  Patient presents with  . Tick Removal    tick bite reaction left side waist pt noticed sunday evening.    HPI Tiffany Vargas is a 63 y.o. female here today with complaint of tick bite.  She removed tick from left hip area 5 days ago.  She cleaned area with peroxide and used bacitracin and covered with a bandaid. She is confident that tick was attached <24 hours. Now has rash around area that is itchy.  She believes some of this may be due to not using a latex free bandaid.  She denies fever, chills, abdominal pain, headache, nausea or joint pain.   ROS:  A comprehensive ROS was completed and negative except as noted per HPI  Allergies  Allergen Reactions  . Hydrocodone Nausea And Vomiting and Other (See Comments)    Disoriented  . Other Diarrhea    Peanuts and Peanut butter  . Latex Hives and Rash    Past Medical History:  Diagnosis Date  . Cataract    OS; Dr Lisette Grinder , [facility]  . Elevated cholesterol   . Hypertension     Past Surgical History:  Procedure Laterality Date  . CATARACT EXTRACTION  06/20/2012   right/LEFT eye, lens implant , Dr Lisette Grinder , [facility]  . CESAREAN SECTION  1995  . COLONOSCOPY  2009   Negative  . DILATION AND CURETTAGE OF UTERUS  1999   Miscarriage  . DORSAL COMPARTMENT RELEASE Right 01/29/2014   Procedure: RIGHT  FIRST DORSAL COMPARTMENT RELEASE (DEQUERVAIN);  Surgeon: Wyn Forster, MD;  Location: [facility];  Service: Orthopedics;  Laterality: Right;  . LASIK  08/2003  . TONSILLECTOMY AND ADENOIDECTOMY  1964    Social History   Socioeconomic History  . Marital status: Widowed    Spouse name: Not on file  . Number of children: Not on file  . Years of education: Not on file  . Highest education level: Not on file  Occupational History  . Not on file  Social Needs  . Financial resource strain: Not on file  . Food insecurity   Worry: Not on file    Inability: Not on file  . Transportation needs    Medical: Not on file    Non-medical: Not on file  Tobacco Use  . Smoking status: Never Smoker  . Smokeless tobacco: Never Used  Substance and Sexual Activity  . Alcohol use: Yes    Alcohol/week: 6.0 standard drinks    Types: 6 Glasses of wine per week    Comment: occasional  wine or liquor  . Drug use: No  . Sexual activity: Never    Birth control/protection: None    Comment: Pt declined sexual hx questions  Lifestyle  . Physical activity    Days per week: Not on file    Minutes per session: Not on file  . Stress: Not on file  Relationships  . Social Musician on phone: Not on file    Gets together: Not on file    Attends religious service: Not on file    Active member of club or organization: Not on file    Attends meetings of clubs or organizations: Not on file    Relationship status: Not on file  Other Topics Concern  . Not on file  Social History Narrative   Exercise; walking  accountant    Family History  Problem Relation Age of Onset  . Hypertension Mother   . Leukemia Mother        maternal family from [place]  . Diabetes Mother        diet & oral meds  . Dementia Mother 49  . Osteoporosis Mother   . Heart failure Father        CHF  . Cataracts Father   . Breast cancer Maternal Grandmother 6  . Cataracts Sister   . Cataracts Sister   . Leukemia Maternal Uncle   . Leukemia Maternal Grandfather   . Stroke Neg Hx     Health Maintenance  Topic Date Due  . MAMMOGRAM  06/14/2020  . Fecal DNA (Cologuard)  10/11/2020  . PAP SMEAR-Modifier  06/21/2021  . TETANUS/TDAP  09/25/2027  . INFLUENZA VACCINE  Completed  . Hepatitis C Screening  Completed  . HIV Screening  Completed     ----------------------------------------------------------------------------------------------------------------------------------------------------------------------------------------------------------------- Physical Exam BP 130/80   Pulse 73   Temp 98.9 F (37.2 C) (Temporal)   Ht 5\' 3"  (1.6 m)   Wt 165 lb (74.8 kg)   LMP 03/06/2014   SpO2 98%   BMI 29.23 kg/m   Physical Exam Constitutional:      Appearance: Normal appearance.  Eyes:     General: No scleral icterus. Skin:    General: Skin is warm.     Comments: Rash to left lateral hip/flank area.  Band-aid shaped with area in center that is slightly raised, non-tender with mild erythema.    Neurological:     General: No focal deficit present.     Mental Status: She is alert.  Psychiatric:        Mood and Affect: Mood normal.        Behavior: Behavior normal.     ------------------------------------------------------------------------------------------------------------------------------------------------------------------------------------------------------------------- Assessment and Plan  Contact dermatitis -Likely due to adhesive or latex in bandage.   -Does not have appearance of rash associated with tick borne illness.  Discussed it would be unlikely that she would have transmission of disease with short period of attachment.   -Discussed s/s of tick borne illnesses and that she should contact our clinic if she develops these symptoms.  -Will treat rash with topical triamcinolone.

## 2019-06-20 NOTE — Patient Instructions (Signed)

## 2019-06-20 NOTE — Assessment & Plan Note (Signed)
-  Likely due to adhesive or latex in bandage.   -Does not have appearance of rash associated with tick borne illness.  Discussed it would be unlikely that she would have transmission of disease with short period of attachment.   -Discussed s/s of tick borne illnesses and that she should contact our clinic if she develops these symptoms.  -Will treat rash with topical triamcinolone.

## 2019-06-30 ENCOUNTER — Other Ambulatory Visit: Payer: Self-pay

## 2019-06-30 ENCOUNTER — Encounter: Payer: BLUE CROSS/BLUE SHIELD | Admitting: Gynecology

## 2019-07-01 ENCOUNTER — Encounter: Payer: Self-pay | Admitting: Gynecology

## 2019-07-01 ENCOUNTER — Ambulatory Visit (INDEPENDENT_AMBULATORY_CARE_PROVIDER_SITE_OTHER): Payer: BC Managed Care – PPO | Admitting: Gynecology

## 2019-07-01 VITALS — BP 134/84 | Ht 63.0 in | Wt 164.0 lb

## 2019-07-01 DIAGNOSIS — Z01419 Encounter for gynecological examination (general) (routine) without abnormal findings: Secondary | ICD-10-CM | POA: Diagnosis not present

## 2019-07-01 DIAGNOSIS — N952 Postmenopausal atrophic vaginitis: Secondary | ICD-10-CM

## 2019-07-01 NOTE — Progress Notes (Signed)
    Tiffany Vargas September 18, 1955 [id number]        63 y.o.  G2P1011 for annual gynecologic exam.  Without gynecologic complaints  Past medical history,surgical history, problem list, medications, allergies, family history and social history were all reviewed and documented as reviewed in the EPIC chart.  ROS:  Performed with pertinent positives and negatives included in the history, assessment and plan.   Additional significant findings : None   Exam: Caryn Bee assistant Vitals:   07/01/19 1052  BP: 134/84  Weight: 164 lb (74.4 kg)  Height: 5\' 3"  (1.6 m)   Body mass index is 29.05 kg/m.  General appearance:  Normal affect, orientation and appearance. Skin: Grossly normal HEENT: Without gross lesions.  No cervical or supraclavicular adenopathy. Thyroid normal.  Lungs:  Clear without wheezing, rales or rhonchi Cardiac: RR, without RMG Abdominal:  Soft, nontender, without masses, guarding, rebound, organomegaly or hernia Breasts:  Examined lying and sitting without masses, retractions, discharge or axillary adenopathy. Pelvic:  Ext, BUS, Vagina: Normal with mild atrophic changes  Cervix: Normal with mild atrophic changes.  Pap smear done  Uterus: Anteverted, normal size, shape and contour, midline and mobile nontender   Adnexa: Without masses or tenderness    Anus and perineum: Normal   Rectovaginal: Normal sphincter tone without palpated masses or tenderness.    Assessment/Plan:  63 y.o. G62P1011 female for annual gynecologic exam.   1. Postmenopausal.  No significant menopausal symptoms or any vaginal bleeding. 2. Mammography 06/2019.  Continue with annual mammography next year.  Breast exam normal today. 3. Pap smear 2017.  Pap smear done today.  No history of abnormal Pap smears previously. 4. DEXA 2017 normal.  Repeat at 5-year interval. 5. Cologuard 2019.  Continue to follow-up with her primary provider for colon screening. 6. Health maintenance.  No routine lab work done  as patient does this elsewhere.  Follow-up 1 year, sooner as needed.   Anastasio Auerbach MD, 11:23 AM 07/01/2019

## 2019-07-01 NOTE — Patient Instructions (Signed)
Follow-up in 1 year, sooner as needed. 

## 2019-07-01 NOTE — Addendum Note (Signed)
Addended by: Nelva Nay on: 07/01/2019 11:45 AM   Modules accepted: Orders

## 2019-07-03 LAB — PAP IG W/ RFLX HPV ASCU

## 2020-01-04 ENCOUNTER — Encounter (HOSPITAL_BASED_OUTPATIENT_CLINIC_OR_DEPARTMENT_OTHER): Payer: Self-pay | Admitting: Emergency Medicine

## 2020-01-04 ENCOUNTER — Emergency Department (HOSPITAL_BASED_OUTPATIENT_CLINIC_OR_DEPARTMENT_OTHER)
Admission: EM | Admit: 2020-01-04 | Discharge: 2020-01-04 | Disposition: A | Discharge status: Home / Self Care | Payer: BC Managed Care – PPO | Attending: Emergency Medicine | Admitting: Emergency Medicine

## 2020-01-04 ENCOUNTER — Other Ambulatory Visit: Payer: Self-pay

## 2020-01-04 DIAGNOSIS — R42 Dizziness and giddiness: Secondary | ICD-10-CM

## 2020-01-04 DIAGNOSIS — Z91040 Latex allergy status: Secondary | ICD-10-CM | POA: Diagnosis not present

## 2020-01-04 DIAGNOSIS — Z885 Allergy status to narcotic agent status: Secondary | ICD-10-CM | POA: Insufficient documentation

## 2020-01-04 DIAGNOSIS — H538 Other visual disturbances: Secondary | ICD-10-CM | POA: Diagnosis not present

## 2020-01-04 DIAGNOSIS — E782 Mixed hyperlipidemia: Secondary | ICD-10-CM | POA: Insufficient documentation

## 2020-01-04 DIAGNOSIS — R519 Headache, unspecified: Secondary | ICD-10-CM | POA: Diagnosis not present

## 2020-01-04 DIAGNOSIS — Z79899 Other long term (current) drug therapy: Secondary | ICD-10-CM | POA: Insufficient documentation

## 2020-01-04 DIAGNOSIS — I10 Essential (primary) hypertension: Secondary | ICD-10-CM | POA: Diagnosis not present

## 2020-01-04 DIAGNOSIS — R350 Frequency of micturition: Secondary | ICD-10-CM | POA: Diagnosis not present

## 2020-01-04 LAB — COMPREHENSIVE METABOLIC PANEL
ALT: 31 U/L (ref 0–44)
AST: 26 U/L (ref 15–41)
Albumin: 4.7 g/dL (ref 3.5–5.0)
Alkaline Phosphatase: 68 U/L (ref 38–126)
Anion gap: 13 (ref 5–15)
BUN: 14 mg/dL (ref 8–23)
CO2: 24 mmol/L (ref 22–32)
Calcium: 9.2 mg/dL (ref 8.9–10.3)
Chloride: 101 mmol/L (ref 98–111)
Creatinine, Ser: 0.69 mg/dL (ref 0.44–1.00)
GFR calc Af Amer: >60 mL/min (ref >60)
GFR calc non Af Amer: >60 mL/min (ref >60)
Glucose, Bld: 103 mg/dL — ABNORMAL HIGH (ref 70–99)
Potassium: 3.6 mmol/L (ref 3.5–5.1)
Sodium: 138 mmol/L (ref 135–145)
Total Bilirubin: 2.2 mg/dL — ABNORMAL HIGH (ref 0.3–1.2)
Total Protein: 7.5 g/dL (ref 6.5–8.1)

## 2020-01-04 LAB — CBC WITH DIFFERENTIAL/PLATELET
Abs Immature Granulocytes: 0.01 10*3/uL (ref 0.00–0.07)
Basophils Absolute: 0 10*3/uL (ref 0.0–0.1)
Basophils Relative: 0 %
Eosinophils Absolute: 0.1 10*3/uL (ref 0.0–0.5)
Eosinophils Relative: 1 %
HCT: 47.6 % — ABNORMAL HIGH (ref 36.0–46.0)
Hemoglobin: 16 g/dL — ABNORMAL HIGH (ref 12.0–15.0)
Immature Granulocytes: 0 %
Lymphocytes Relative: 32 %
Lymphs Abs: 2.2 10*3/uL (ref 0.7–4.0)
MCH: 29.6 pg (ref 26.0–34.0)
MCHC: 33.6 g/dL (ref 30.0–36.0)
MCV: 88 fL (ref 80.0–100.0)
Monocytes Absolute: 0.4 10*3/uL (ref 0.1–1.0)
Monocytes Relative: 5 %
Neutro Abs: 4.2 10*3/uL (ref 1.7–7.7)
Neutrophils Relative %: 62 %
Platelets: 219 10*3/uL (ref 150–400)
RBC: 5.41 MIL/uL — ABNORMAL HIGH (ref 3.87–5.11)
RDW: 12.8 % (ref 11.5–15.5)
WBC: 6.8 10*3/uL (ref 4.0–10.5)
nRBC: 0 % (ref 0.0–0.2)

## 2020-01-04 LAB — URINALYSIS, ROUTINE W REFLEX MICROSCOPIC
Bilirubin Urine: NEGATIVE
Glucose, UA: NEGATIVE mg/dL
Hgb urine dipstick: NEGATIVE
Ketones, ur: 40 mg/dL — AB
Leukocytes,Ua: NEGATIVE
Nitrite: NEGATIVE
Protein, ur: NEGATIVE mg/dL
Specific Gravity, Urine: 1.02 (ref 1.005–1.030)
pH: 6 (ref 5.0–8.0)

## 2020-01-04 LAB — CBG MONITORING, ED: Glucose-Capillary: 107 mg/dL — ABNORMAL HIGH (ref 70–99)

## 2020-01-04 MED ORDER — SODIUM CHLORIDE 0.9 % IV BOLUS
500.0000 mL | Freq: Once | INTRAVENOUS | Status: AC
  Administered 2020-01-04: 500 mL via INTRAVENOUS

## 2020-01-04 MED ORDER — SODIUM CHLORIDE 0.9 % IV BOLUS
1000.0000 mL | Freq: Once | INTRAVENOUS | Status: AC
  Administered 2020-01-04: 1000 mL via INTRAVENOUS

## 2020-01-04 MED ORDER — MECLIZINE HCL 12.5 MG PO TABS
12.5000 mg | ORAL_TABLET | Freq: Three times a day (TID) | ORAL | 0 refills | Status: DC | PRN
Start: 2020-01-04 — End: 2020-02-25

## 2020-01-04 NOTE — ED Triage Notes (Signed)
Pt c/o dizziness upon waking. Pt denies HA reports "sinus pressure", denies N/V, denies CP or sob. Pt aox4. Pt reports falling, denies hitting head. Pt endorses drinking wine last night

## 2020-01-04 NOTE — ED Notes (Signed)
Pt ambulatory with steady gait from bathroom 

## 2020-01-04 NOTE — ED Provider Notes (Signed)
[facility] DEPARTMENT Provider Note   CSN: [id number] Arrival date & time: 01/04/20  1314     History Chief Complaint  Patient presents with  . Dizziness    Tiffany Vargas is a 64 y.o. female.  HPI   Patient is a 64 year old female with a history of cataracts, hyperlipidemia, hypertension, who presents to the emergency department today for evaluation of lightheadedness.  States she woke up this morning and upon standing felt a sensation of lightheadedness.  She was able to take her dog on a walk following this but had persistent symptoms of lightheadedness for the next several hours.  Symptoms are not present at rest and mostly only occur while standing.  They do not occur every time she stands.  She denies any vertiginous symptoms.  She feels like she may have had some bilateral blurred vision which seems to be improving.  She denies any double vision.  She reports some sinus pressure but denies any overt headaches.  She has a history of seasonal allergies and has had rhinorrhea/congestion from this.  She denies any chest pain, shortness of breath, abdominal pain, nausea, vomiting, diarrhea.  She felt like she has been urinating more frequently recently.  She has been eating and drinking normally.  States that she drank alcohol last night and had more than she usually does.  She had several glasses of wine.  Past Medical History:  Diagnosis Date  . Cataract    OS; Dr Isaiah Blakes , [facility]  . Elevated cholesterol   . Hypertension     Patient Active Problem List   Diagnosis Date Noted  . Contact dermatitis 06/20/2019  . Trigger ring finger of right hand 10/16/2018  . Well woman exam 09/24/2017  . Grief 09/24/2017  . Allergic rhinitis 06/12/2012  . GILBERT'S SYNDROME 11/28/2007  . Hyperlipidemia 11/14/2007  . Essential hypertension 11/14/2007  . MIGRAINES, HX OF 11/14/2007    Past Surgical History:  Procedure Laterality Date  . CATARACT EXTRACTION  06/20/2012   right/LEFT eye, lens implant , Dr Isaiah Blakes , [facility]  . CESAREAN SECTION  1995  . COLONOSCOPY  2009   Negative  . DILATION AND CURETTAGE OF UTERUS  1999   Miscarriage  . DORSAL COMPARTMENT RELEASE Right 01/29/2014   Procedure: RIGHT  FIRST DORSAL COMPARTMENT RELEASE (DEQUERVAIN);  Surgeon: Cammie Sickle, MD;  Location: [facility];  Service: Orthopedics;  Laterality: Right;  . LASIK  08/2003  . TONSILLECTOMY AND ADENOIDECTOMY  1964     OB History    Gravida  2   Para  1   Term  1   Preterm      AB  1   Living  1     SAB      TAB      Ectopic      Multiple      Live Births              Family History  Problem Relation Age of Onset  . Hypertension Mother   . Leukemia Mother        maternal family from [place]  . Diabetes Mother        diet & oral meds  . Dementia Mother 2  . Osteoporosis Mother   . Heart failure Father        CHF  . Cataracts Father   . Breast cancer Maternal Grandmother 2  . Cataracts Sister   . Cataracts Sister   .  Leukemia Maternal Uncle   . Leukemia Maternal Grandfather   . Stroke Neg Hx     Social History   Tobacco Use  . Smoking status: Never Smoker  . Smokeless tobacco: Never Used  Substance Use Topics  . Alcohol use: Yes    Alcohol/week: 6.0 standard drinks    Types: 6 Glasses of wine per week    Comment: occasional  wine or liquor  . Drug use: No    Home Medications Prior to Admission medications   Medication Sig Start Date End Date Taking? Authorizing Provider  cetirizine (ZYRTEC) 10 MG tablet Take 10 mg by mouth daily.      [provider]  meclizine (ANTIVERT) 12.5 MG tablet Take 1 tablet (12.5 mg total) by mouth 3 (three) times daily as needed for dizziness. 01/04/20   Kitara Hebb S, PA-C  Multiple Vitamins-Minerals (MULTIVITAMIN PO) Take 1 tablet by mouth daily.     [provider]  triamcinolone cream (KENALOG) 0.1 % Apply 1 application topically 2 (two) times daily.  06/20/19   Everrett Coombe, DO    Allergies    Hydrocodone, Other, and Latex  Review of Systems   Review of Systems  Constitutional: Negative for fever.  HENT: Negative for ear pain and sore throat.   Eyes: Positive for visual disturbance.  Respiratory: Negative for cough and shortness of breath.   Cardiovascular: Negative for chest pain.  Gastrointestinal: Negative for abdominal pain, constipation, diarrhea, nausea and vomiting.  Genitourinary: Positive for frequency. Negative for dysuria and hematuria.  Musculoskeletal: Negative for back pain.  Skin: Negative for rash.  Neurological: Positive for light-headedness and headaches (mild sinus pressure). Negative for dizziness, seizures, syncope, weakness and numbness.  All other systems reviewed and are negative.   Physical Exam Updated Vital Signs BP (!) 158/94 (BP Location: Right Arm)   Pulse 87   Temp 98.6 F (37 C) (Oral)   Resp 16   Ht 5\' 3"  (1.6 m)   Wt 72.6 kg   LMP 03/06/2014   SpO2 100%   BMI 28.34 kg/m   Physical Exam Vitals and nursing note reviewed.  Constitutional:      General: She is not in acute distress.    Appearance: She is well-developed.  HENT:     Head: Normocephalic and atraumatic.     Mouth/Throat:     Mouth: Mucous membranes are dry.  Eyes:     Conjunctiva/sclera: Conjunctivae normal.  Cardiovascular:     Rate and Rhythm: Normal rate and regular rhythm.     Pulses: Normal pulses.     Heart sounds: Normal heart sounds. No murmur.  Pulmonary:     Effort: Pulmonary effort is normal. No respiratory distress.     Breath sounds: Normal breath sounds. No wheezing, rhonchi or rales.  Abdominal:     General: Bowel sounds are normal.     Palpations: Abdomen is soft.     Tenderness: There is no abdominal tenderness. There is no guarding or rebound.  Musculoskeletal:     Cervical back: Neck supple.  Skin:    General: Skin is warm and dry.  Neurological:     Mental Status: She is alert.      Comments: Mental Status:  Alert, thought content appropriate, able to give a coherent history. Speech fluent without evidence of aphasia. Able to follow 2 step commands without difficulty.  Cranial Nerves:  II:  Peripheral visual fields grossly normal, pupils equal, round, reactive to light III,IV, VI: ptosis not present,  extra-ocular motions intact bilaterally  V,VII: smile symmetric, facial light touch sensation equal VIII: hearing grossly normal to voice  X: uvula elevates symmetrically  XI: bilateral shoulder shrug symmetric and strong XII: midline tongue extension without fassiculations Motor:  Normal tone. 5/5 strength of BUE and BLE major muscle groups including strong and equal grip strength and dorsiflexion/plantar flexion Sensory: light touch normal in all extremities. Cerebellar: normal finger-to-nose with bilateral upper extremities, normal heel to shin Gait: normal gait and balance. Neg romberg     ED Results / Procedures / Treatments   Labs (all labs ordered are listed, but only abnormal results are displayed) Labs Reviewed  CBC WITH DIFFERENTIAL/PLATELET - Abnormal; Notable for the following components:      Result Value   RBC 5.41 (*)    Hemoglobin 16.0 (*)    HCT 47.6 (*)    All other components within normal limits  COMPREHENSIVE METABOLIC PANEL - Abnormal; Notable for the following components:   Glucose, Bld 103 (*)    Total Bilirubin 2.2 (*)    All other components within normal limits  URINALYSIS, ROUTINE W REFLEX MICROSCOPIC - Abnormal; Notable for the following components:   Ketones, ur 40 (*)    All other components within normal limits  CBG MONITORING, ED - Abnormal; Notable for the following components:   Glucose-Capillary 107 (*)    All other components within normal limits    EKG EKG Interpretation  Date/Time:  Sunday Jan 04 2020 13:30:45 EDT Ventricular Rate:  85 PR Interval:    QRS Duration: 99 QT Interval:  387 QTC Calculation: 461 R  Axis:   -7 Text Interpretation: Sinus rhythm Confirmed by Dykstra, Richard ([id number]) on 01/04/2020 4:24:35 PM   Radiology No results found.  Procedures Procedures (including critical care time)   Visual Acuity  Right Eye Distance: 20/32 Left Eye Distance: 20/20 Bilateral Distance: 20/32  Right Eye Near:   Left Eye Near:    Bilateral Near:      Medications Ordered in ED Medications  sodium chloride 0.9 % bolus 1,000 mL (0 mLs Intravenous Stopped 01/04/20 1555)  sodium chloride 0.9 % bolus 500 mL (500 mLs Intravenous New Bag/Given 01/04/20 1555)    ED Course  I have reviewed the triage vital signs and the nursing notes.  Pertinent labs & imaging results that were available during my care of the patient were reviewed by me and considered in my medical decision making (see chart for details).    MDM Rules/Calculators/A&P                      63  year old female presenting for evaluation of positional lightheadedness that started this morning upon waking.  Did consume alcohol last night.  She had some feeling of bilateral blurred vision that has improved but otherwise does not have any associated neuro complaints.  She has a little bit of hypertension but otherwise her vital signs are reassuring.  She is orthostatic.  On exam, she is neurologically intact without any focal deficits.  She is ambulatory here in the ED without difficulty.  Visual fields are intact.  Visual acuity was performed and is normal.  Will obtain labs, EKG  CBC with elevated hemoglobin, suggest hemoconcentration secondary to dehydration.  No leukocytosis CMP with is without gross electrolyte derangement.  Normal kidney and liver function.  Bilirubin is elevated but consistent with prior.  Has history of Gilbert's syndrome. CBG is within normal UA shows ketonuria suggesting dehydration.  No evidence  for UTI.  EKG shows normal sinus rhythm, no ischemic changes and no arrhythmia.  Patient was given fluid bolus.   On reassessment she states she is feeling improved.  Concerned her symptoms may have been secondary to mild dehydration.  I doubt emergent neurologic cause of symptoms at this time. I will give rx for meclizine. do feel that she will need to follow-up with her PCP in regards to her symptoms today.  She will also need to follow-up about her elevated blood pressure.  Advised to keep BP log at home.  Advised on specific return precautions.  She voices understanding of plan and reasons to return.  Questions answered.  Patient stable for discharge.  Pt seen in conjunction with Dr. Stevie Kern who personally evaluated the patient and is in agreement with the plan.   Final Clinical Impression(s) / ED Diagnoses Final diagnoses:  Lightheadedness    Rx / DC Orders ED Discharge Orders         Ordered    meclizine (ANTIVERT) 12.5 MG tablet  3 times daily PRN     01/04/20 1636           Karrie Meres, PA-C 01/04/20 1638    Milagros Loll, MD 01/07/20 [phone]

## 2020-01-04 NOTE — ED Notes (Signed)
ED Provider at bedside. 

## 2020-01-04 NOTE — Discharge Instructions (Addendum)
Prescription given for Meclizine. Take medication as directed and do not operate machinery, drive a car, or work while taking this medication as it can make you drowsy.   Please follow up with your primary care provider within 5-7 days for re-evaluation of your symptoms. If you do not have a primary care provider, information for a healthcare clinic has been provided for you to make arrangements for follow up care. Please return to the emergency department for any new or worsening symptoms.

## 2020-01-13 ENCOUNTER — Other Ambulatory Visit: Payer: Self-pay

## 2020-01-14 ENCOUNTER — Ambulatory Visit: Payer: BC Managed Care – PPO | Admitting: Family Medicine

## 2020-01-14 ENCOUNTER — Encounter: Payer: Self-pay | Admitting: Family Medicine

## 2020-01-14 VITALS — BP 142/90 | HR 85 | Temp 98.2°F | Ht 63.0 in | Wt 161.0 lb

## 2020-01-14 DIAGNOSIS — I10 Essential (primary) hypertension: Secondary | ICD-10-CM

## 2020-01-14 MED ORDER — BENAZEPRIL HCL 10 MG PO TABS: 10.0000 mg | ORAL_TABLET | Freq: Every day | ORAL | 1 refills | Status: DC

## 2020-01-14 NOTE — Progress Notes (Signed)
Tiffany Vargas is a 64 y.o. female  Chief Complaint  Patient presents with  . Hypertension    Pt had a ER visit last week for dizziness.  Pt was to follow up with PCP    HPI: Tiffany Vargas is a 64 y.o. female who is a former patient of Dr. Dayton Martes who is seen today for HTN f/u. She was on benzapril 20mg  in the past but this was stopped a few years agobe when pt lost weight.   She was seen in the ER at [facility] on 01/04/20 for lightheadedness, ? Blurred vision. She was given IVF and symptoms improved so they were thought to be d/t mild dehydration. EKG normal, UA showed ketonuria but not evidence of UTI, electrolytes normal. BP was elevated in ER and it was recommended she f/u with PCP.  No CP, SOB.  Home BPs since ER have been in 140's/90s.  Past Medical History:  Diagnosis Date  . Cataract    OS; Dr 01/06/20 , [facility]  . Elevated cholesterol   . Hypertension     Past Surgical History:  Procedure Laterality Date  . CATARACT EXTRACTION  06/20/2012   right/LEFT eye, lens implant , Dr 13/02/2012 , [facility]  . CESAREAN SECTION  1995  . COLONOSCOPY  2009   Negative  . DILATION AND CURETTAGE OF UTERUS  1999   Miscarriage  . DORSAL COMPARTMENT RELEASE Right 01/29/2014   Procedure: RIGHT  FIRST DORSAL COMPARTMENT RELEASE (DEQUERVAIN);  Surgeon: 01/31/2014, MD;  Location: [facility];  Service: Orthopedics;  Laterality: Right;  . LASIK  08/2003  . TONSILLECTOMY AND ADENOIDECTOMY  1964    Social History   Socioeconomic History  . Marital status: Widowed    Spouse name: Not on file  . Number of children: Not on file  . Years of education: Not on file  . Highest education level: Not on file  Occupational History  . Not on file  Tobacco Use  . Smoking status: Never Smoker  . Smokeless tobacco: Never Used  Substance and Sexual Activity  . Alcohol use: Yes    Alcohol/week: 6.0 standard drinks    Types: 6 Glasses of wine per week    Comment: occasional  wine or liquor    . Drug use: No  . Sexual activity: Not Currently    Comment: Pt declined sexual hx questions  Other Topics Concern  . Not on file  Social History Narrative   Exercise; walking      accountant   Social Determinants of Health   Financial Resource Strain:   . Difficulty of Paying Living Expenses:   Food Insecurity:   . Worried About 1964 in the Last Year:   . Programme researcher, broadcasting/film/video in the Last Year:   Transportation Needs:   . Barista (Medical):   Freight forwarder Lack of Transportation (Non-Medical):   Physical Activity:   . Days of Exercise per Week:   . Minutes of Exercise per Session:   Stress:   . Feeling of Stress :   Social Connections:   . Frequency of Communication with Friends and Family:   . Frequency of Social Gatherings with Friends and Family:   . Attends Religious Services:   . Active Member of Clubs or Organizations:   . Attends [employer] Meetings:   Banker Marital Status:   Intimate Partner Violence:   . Fear of Current or Ex-Partner:   .  Emotionally Abused:   [employer] Physically Abused:   . Sexually Abused:     Family History  Problem Relation Age of Onset  . Hypertension Mother   . Leukemia Mother        maternal family from [place]  . Diabetes Mother        diet & oral meds  . Dementia Mother 55  . Osteoporosis Mother   . Heart failure Father        CHF  . Cataracts Father   . Breast cancer Maternal Grandmother 49  . Cataracts Sister   . Cataracts Sister   . Leukemia Maternal Uncle   . Leukemia Maternal Grandfather   . Stroke Neg Hx      Immunization History  Administered Date(s) Administered  . Influenza Inj Mdck Quad Pf 05/08/2018  . Influenza Whole 05/14/2008, 05/14/2012  . Influenza,inj,Quad PF,6+ Mos 06/13/2016, 05/12/2017, 04/22/2019  . Influenza-Unspecified 04/22/2019  . Td 04/05/2006  . Tdap 09/24/2017  . Zoster Recombinat (Shingrix) 10/09/2017, 12/11/2017    Outpatient Encounter Medications as of 01/14/2020   Medication Sig  . cetirizine (ZYRTEC) 10 MG tablet Take 10 mg by mouth daily.    . Flaxseed, Linseed, 1000 MG CAPS Take by mouth.  . Multiple Vitamins-Minerals (MULTIVITAMIN PO) Take 1 tablet by mouth daily.   Marland Kitchen triamcinolone cream (KENALOG) 0.1 % Apply 1 application topically 2 (two) times daily.  . Vitamins/Minerals TABS Take by mouth.  . meclizine (ANTIVERT) 12.5 MG tablet Take 1 tablet (12.5 mg total) by mouth 3 (three) times daily as needed for dizziness. (Patient not taking: Reported on 01/14/2020)   No facility-administered encounter medications on file as of 01/14/2020.     ROS: Pertinent positives and negatives noted in HPI. Remainder of ROS non-contributory    Allergies  Allergen Reactions  . Hydrocodone Nausea And Vomiting and Other (See Comments)    Disoriented  . Other Diarrhea    Peanuts and Peanut butter  . Latex Hives and Rash    BP (!) 142/90 (BP Location: Left Arm, Patient Position: Sitting, Cuff Size: Normal)   Pulse 85   Temp 98.2 F (36.8 C) (Temporal)   Ht 5\' 3"  (1.6 m)   Wt 161 lb (73 kg)   LMP 03/06/2014   SpO2 96%   BMI 28.52 kg/m    BP Readings from Last 3 Encounters:  01/14/20 (!) 142/90  01/04/20 (!) 148/86  07/01/19 134/84   Pulse Readings from Last 3 Encounters:  01/14/20 85  01/04/20 76  06/20/19 73   Wt Readings from Last 3 Encounters:  01/14/20 161 lb (73 kg)  01/04/20 160 lb (72.6 kg)  07/01/19 164 lb (74.4 kg)     Physical Exam  Constitutional: She is oriented to person, place, and time. She appears well-developed and well-nourished. No distress.  Cardiovascular: Normal rate, regular rhythm, normal heart sounds and intact distal pulses.  Pulmonary/Chest: Effort normal and breath sounds normal. No respiratory distress.  Musculoskeletal:        General: No edema.  Neurological: She is alert and oriented to person, place, and time.  Psychiatric: She has a normal mood and affect. Her behavior is normal.     A/P:      This visit occurred during the SARS-CoV-2 public health emergency.  Safety protocols were in place, including screening questions prior to the visit, additional usage of staff PPE, and extensive cleaning of exam room while observing appropriate contact time as indicated for disinfecting solutions.

## 2020-02-24 ENCOUNTER — Other Ambulatory Visit: Payer: Self-pay

## 2020-02-25 ENCOUNTER — Ambulatory Visit: Payer: BC Managed Care – PPO | Admitting: Family Medicine

## 2020-02-25 ENCOUNTER — Encounter: Payer: Self-pay | Admitting: Family Medicine

## 2020-02-25 VITALS — BP 110/78 | HR 81 | Temp 98.6°F | Ht 63.0 in | Wt 162.0 lb

## 2020-02-25 DIAGNOSIS — E785 Hyperlipidemia, unspecified: Secondary | ICD-10-CM

## 2020-02-25 DIAGNOSIS — Z1231 Encounter for screening mammogram for malignant neoplasm of breast: Secondary | ICD-10-CM

## 2020-02-25 DIAGNOSIS — I10 Essential (primary) hypertension: Secondary | ICD-10-CM | POA: Diagnosis not present

## 2020-02-25 NOTE — Addendum Note (Signed)
Addended by: Overton Mam on: 02/25/2020 03:35 PM   Modules accepted: Orders

## 2020-02-25 NOTE — Progress Notes (Signed)
Tiffany Vargas is a 64 y.o. female  Chief Complaint  Patient presents with  . Establish Care    Pt here for TOC visit. Pt also is following up on BP med.    HPI: Tiffany Vargas is a 64 y.o. female seen today for [facility] appt, previous PCP Dr. Dayton Martes. She is here to f/u on HTN and at last OV on 01/14/20 she was started on benzapril 10mg  daily.    Past Medical History:  Diagnosis Date  . Cataract    OS; Dr , [facility]  . Elevated cholesterol   . Hypertension     Past Surgical History:  Procedure Laterality Date  . CATARACT EXTRACTION  06/20/2012   right/LEFT eye, lens implant , Dr 13/02/2012 , [facility]  . CESAREAN SECTION  1995  . COLONOSCOPY  2009   Negative  . DILATION AND CURETTAGE OF UTERUS  1999   Miscarriage  . DORSAL COMPARTMENT RELEASE Right 01/29/2014   Procedure: RIGHT  FIRST DORSAL COMPARTMENT RELEASE (DEQUERVAIN);  Surgeon: 01/31/2014, MD;  Location: [facility];  Service: Orthopedics;  Laterality: Right;  . LASIK  08/2003  . TONSILLECTOMY AND ADENOIDECTOMY  1964    Social History   Socioeconomic History  . Marital status: Widowed    Spouse name: Not on file  . Number of children: Not on file  . Years of education: Not on file  . Highest education level: Not on file  Occupational History  . Not on file  Tobacco Use  . Smoking status: Never Smoker  . Smokeless tobacco: Never Used  Vaping Use  . Vaping Use: Never used  Substance and Sexual Activity  . Alcohol use: Yes    Alcohol/week: 6.0 standard drinks    Types: 6 Glasses of wine per week    Comment: occasional  wine or liquor  . Drug use: No  . Sexual activity: Not Currently    Comment: Pt declined sexual hx questions  Other Topics Concern  . Not on file  Social History Narrative   Exercise; walking      accountant   Social Determinants of Health   Financial Resource Strain:   . Difficulty of Paying Living Expenses:   Food Insecurity:   . Worried About 1964 in the  Last Year:   . Programme researcher, broadcasting/film/video in the Last Year:   Transportation Needs:   . Barista (Medical):   Freight forwarder Lack of Transportation (Non-Medical):   Physical Activity:   . Days of Exercise per Week:   . Minutes of Exercise per Session:   Stress:   . Feeling of Stress :   Social Connections:   . Frequency of Communication with Friends and Family:   . Frequency of Social Gatherings with Friends and Family:   . Attends Religious Services:   . Active Member of Clubs or Organizations:   . Attends [place] Meetings:   Banker Marital Status:   Intimate Partner Violence:   . Fear of Current or Ex-Partner:   . Emotionally Abused:   [place] Physically Abused:   . Sexually Abused:     Family History  Problem Relation Age of Onset  . Hypertension Mother   . Leukemia Mother        maternal family from [place]  . Diabetes Mother        diet & oral meds  . Dementia Mother 20  . Osteoporosis Mother   .  Heart failure Father        CHF  . Cataracts Father   . Breast cancer Maternal Grandmother 1  . Cataracts Sister   . Cataracts Sister   . Leukemia Maternal Uncle   . Leukemia Maternal Grandfather   . Stroke Neg Hx      Immunization History  Administered Date(s) Administered  . Influenza Inj Mdck Quad Pf 05/08/2018  . Influenza Whole 05/14/2008, 05/14/2012  . Influenza,inj,Quad PF,6+ Mos 06/13/2016, 05/12/2017, 04/22/2019  . Influenza-Unspecified 04/22/2019  . Td 04/05/2006  . Tdap 09/24/2017  . Zoster Recombinat (Shingrix) 10/09/2017, 12/11/2017    Outpatient Encounter Medications as of 02/25/2020  Medication Sig  . benazepril (LOTENSIN) 10 MG tablet Take 1 tablet (10 mg total) by mouth daily.  . cetirizine (ZYRTEC) 10 MG tablet Take 10 mg by mouth daily.    . Flaxseed, Linseed, 1000 MG CAPS Take by mouth.  . Multiple Vitamins-Minerals (MULTIVITAMIN PO) Take 1 tablet by mouth daily.   [place] triamcinolone cream (KENALOG) 0.1 % Apply 1 application topically 2 (two) times  daily.  . meclizine (ANTIVERT) 12.5 MG tablet Take 1 tablet (12.5 mg total) by mouth 3 (three) times daily as needed for dizziness. (Patient not taking: Reported on 01/14/2020)  . Vitamins/Minerals TABS Take by mouth.   No facility-administered encounter medications on file as of 02/25/2020.     ROS: Pertinent positives and negatives noted in HPI. Remainder of ROS non-contributory    Allergies  Allergen Reactions  . Hydrocodone Nausea And Vomiting and Other (See Comments)    Disoriented  . Other Diarrhea    Peanuts and Peanut butter  . Latex Hives and Rash    BP 110/78 (BP Location: Left Arm, Patient Position: Sitting, Cuff Size: Normal)   Pulse 81   Temp 98.6 F (37 C) (Temporal)   Ht 5\' 3"  (1.6 m)   Wt 162 lb (73.5 kg)   LMP 03/06/2014   SpO2 95%   BMI 28.70 kg/m  BP Readings from Last 3 Encounters:  02/25/20 110/78  01/14/20 (!) 142/90  01/04/20 (!) 148/86    Physical Exam Vitals reviewed.  Constitutional:      General: She is not in acute distress.    Appearance: Normal appearance. She is not ill-appearing.  Cardiovascular:     Rate and Rhythm: Normal rate and regular rhythm.     Pulses: Normal pulses.     Heart sounds: Normal heart sounds.  Pulmonary:     Effort: Pulmonary effort is normal.     Breath sounds: Normal breath sounds.  Neurological:     General: No focal deficit present.     Mental Status: She is alert and oriented to person, place, and time.  Psychiatric:        Mood and Affect: Mood normal.        Behavior: Behavior normal.      A/P:  1. Essential hypertension - controlled, at goal - cont current med, low sodium diet, regular CV exercise - f/u in 1 year or sooner PRN    This visit occurred during the SARS-CoV-2 public health emergency.  Safety protocols were in place, including screening questions prior to the visit, additional usage of staff PPE, and extensive cleaning of exam room while observing appropriate contact time as  indicated for disinfecting solutions.

## 2020-02-25 NOTE — Addendum Note (Signed)
Addended by: Overton Mam on: 02/25/2020 03:23 PM   Modules accepted: Orders

## 2020-03-01 ENCOUNTER — Other Ambulatory Visit: Payer: Self-pay

## 2020-03-02 ENCOUNTER — Other Ambulatory Visit (INDEPENDENT_AMBULATORY_CARE_PROVIDER_SITE_OTHER): Payer: BC Managed Care – PPO

## 2020-03-02 DIAGNOSIS — E785 Hyperlipidemia, unspecified: Secondary | ICD-10-CM

## 2020-03-02 LAB — HEMOGLOBIN A1C: Hgb A1c MFr Bld: 5.7 % (ref 4.6–6.5)

## 2020-03-02 LAB — LDL CHOLESTEROL, DIRECT: Direct LDL: 138 mg/dL

## 2020-03-02 LAB — LIPID PANEL
Cholesterol: 250 mg/dL — ABNORMAL HIGH (ref 0–200)
HDL: 44 mg/dL (ref >39.00)
NonHDL: 206.05
Total CHOL/HDL Ratio: 6
Triglycerides: 267 mg/dL — ABNORMAL HIGH (ref 0.0–149.0)
VLDL: 53.4 mg/dL — ABNORMAL HIGH (ref 0.0–40.0)

## 2020-03-03 ENCOUNTER — Encounter: Payer: Self-pay | Admitting: Family Medicine

## 2020-06-15 DIAGNOSIS — L821 Other seborrheic keratosis: Secondary | ICD-10-CM | POA: Diagnosis not present

## 2020-06-15 DIAGNOSIS — Z1283 Encounter for screening for malignant neoplasm of skin: Secondary | ICD-10-CM | POA: Diagnosis not present

## 2020-06-21 DIAGNOSIS — Z1231 Encounter for screening mammogram for malignant neoplasm of breast: Secondary | ICD-10-CM | POA: Diagnosis not present

## 2020-06-23 ENCOUNTER — Encounter: Payer: Self-pay | Admitting: Gynecology

## 2020-07-01 DIAGNOSIS — R928 Other abnormal and inconclusive findings on diagnostic imaging of breast: Secondary | ICD-10-CM | POA: Diagnosis not present

## 2020-07-01 DIAGNOSIS — R921 Mammographic calcification found on diagnostic imaging of breast: Secondary | ICD-10-CM | POA: Diagnosis not present

## 2020-07-01 DIAGNOSIS — R922 Inconclusive mammogram: Secondary | ICD-10-CM | POA: Diagnosis not present

## 2020-07-04 ENCOUNTER — Other Ambulatory Visit: Payer: Self-pay | Admitting: Family Medicine

## 2020-07-04 DIAGNOSIS — I10 Essential (primary) hypertension: Secondary | ICD-10-CM

## 2020-07-05 ENCOUNTER — Encounter: Payer: BC Managed Care – PPO | Admitting: Obstetrics and Gynecology

## 2020-07-05 ENCOUNTER — Encounter: Payer: BC Managed Care – PPO | Admitting: Gynecology

## 2020-07-05 NOTE — Telephone Encounter (Signed)
Last OV 02/25/20 Last fill 01/14/20  #90/1

## 2020-07-13 DIAGNOSIS — Z961 Presence of intraocular lens: Secondary | ICD-10-CM | POA: Diagnosis not present

## 2020-07-13 DIAGNOSIS — H10413 Chronic giant papillary conjunctivitis, bilateral: Secondary | ICD-10-CM | POA: Diagnosis not present

## 2020-07-13 DIAGNOSIS — H524 Presbyopia: Secondary | ICD-10-CM | POA: Diagnosis not present

## 2020-07-22 ENCOUNTER — Ambulatory Visit: Payer: BC Managed Care – PPO | Admitting: Obstetrics and Gynecology

## 2020-07-22 ENCOUNTER — Other Ambulatory Visit: Payer: Self-pay

## 2020-07-22 ENCOUNTER — Encounter: Payer: Self-pay | Admitting: Obstetrics and Gynecology

## 2020-07-22 VITALS — BP 124/80 | Ht 63.0 in | Wt 163.0 lb

## 2020-07-22 DIAGNOSIS — Z01419 Encounter for gynecological examination (general) (routine) without abnormal findings: Secondary | ICD-10-CM | POA: Diagnosis not present

## 2020-07-22 DIAGNOSIS — N390 Urinary tract infection, site not specified: Secondary | ICD-10-CM | POA: Diagnosis not present

## 2020-07-22 DIAGNOSIS — N952 Postmenopausal atrophic vaginitis: Secondary | ICD-10-CM

## 2020-07-22 MED ORDER — ESTRADIOL 0.1 MG/GM VA CREA
TOPICAL_CREAM | VAGINAL | 12 refills | Status: DC
Start: 2020-07-22 — End: 2021-06-08

## 2020-07-22 NOTE — Progress Notes (Signed)
Tiffany Vargas 06/23/1956 [id number]  SUBJECTIVE:  64 y.o. Vargas female here for a breast and pelvic exam. She has no gynecologic concerns. Has been noting recurrent urinary tract infections, also with increased urinary frequency.  She says that she has had about three or four UTIs in the past year or so.  Desires to look into options.  Current Outpatient Medications  Medication Sig Dispense Refill  . benazepril (LOTENSIN) 10 MG tablet TAKE 1 TABLET BY MOUTH EVERY DAY 90 tablet 1  . cetirizine (ZYRTEC) 10 MG tablet Take 10 mg by mouth daily.    . Flaxseed, Linseed, 1000 MG CAPS Take by mouth.    . Multiple Vitamins-Minerals (MULTIVITAMIN PO) Take 1 tablet by mouth daily.     [place] triamcinolone cream (KENALOG) 0.1 % Apply 1 application topically 2 (two) times daily. 30 g 0   No current facility-administered medications for this visit.   Allergies: Hydrocodone, Other, and Latex  Patient's last menstrual period was 03/06/2014.  Past medical history,surgical history, problem list, medications, allergies, family history and social history were all reviewed and documented as reviewed in the EPIC chart.  GYN ROS: no abnormal bleeding, pelvic pain or discharge, no breast pain or new or enlarging lumps on self exam.  No dysuria, urinary frequency, pain with urination, cloudy/malodorous urine.   OBJECTIVE:  BP 124/80   Ht 5\' 3"  (1.6 m)   Wt 163 lb (73.9 kg)   LMP 03/06/2014   BMI 28.87 kg/m  The patient appears well, alert, oriented, in no distress. Heart with regular rate and rhythm Lungs are clear to auscultation bilaterally, no wheezes, rhonchi, rales BREAST EXAM: breasts appear normal, no suspicious masses, no skin or nipple changes or axillary nodes PELVIC EXAM: VULVA: normal appearing vulva with atrophic change, no masses, tenderness or lesions, VAGINA: normal appearing vagina with atrophic change, normal color and discharge, no lesions, CERVIX: normal appearing atrophic cervix without  discharge or lesions, UTERUS: uterus is normal size, shape, consistency and nontender, ADNEXA: normal adnexa in size, nontender and no masses Chaperone: 03/08/2014 present during the examination  ASSESSMENT:  Tiffany Vargas here for a breast and pelvic exam  PLAN:   1. Postmenopausal.  No significant hot flashes or night sweats.  No vaginal bleeding.  She does complain of recurring urinary tract infections.  We discussed that sometimes vaginal estrogen deficiency in the tissues can result in recurrence, she indicated not sexually active.  She says she tends to get more recurrence when she is not consuming as much fluids prior to traveling.  We discussed prophylactic biotic therapies versus vaginal estrogen.  Like to try vaginal estrogen therapy so I gave her a prescription for Estrace to use nightly for 2 weeks, every other night for another 2 weeks, then twice weekly after that.  If no improvements and continued ongoing recurrence of UTIs in the next 2 to 3 months then she might want to seek evaluation by urology.  Risks of estrogen use including systemic absorption from vaginal cream are reviewed to include thrombotic disease such as heart attack, stroke, DVT, PE and the breast cancer issue. 2. Pap smear 2020.  No significant history of abnormal Pap smears.  Next Pap smear due 2023 following the current guidelines recommending the 3 year interval. 3. Mammogram 06/2020.  Normal breast exam today.  Continue with annual mammograms. 4. Colonoscopy 2009, cologuard 2019.  She will follow up at the interval recommended by her primary doctor. 5. DEXA 2017.  Normal.  Next DEXA recommended 2022 at 5 year interval.  She gets calcium in her diet and also takes a multivitamin, I asked her to check in with her primary doctor to recheck her vitamin D levels at the next set of labs sure she is getting an adequate amount. 6. Health maintenance.  No labs today as she normally has these completed elsewhere.  Return  annually or sooner, prn.  Theresia Majors MD 07/22/20

## 2020-12-26 ENCOUNTER — Other Ambulatory Visit: Payer: Self-pay | Admitting: Family Medicine

## 2020-12-26 DIAGNOSIS — I10 Essential (primary) hypertension: Secondary | ICD-10-CM

## 2020-12-30 ENCOUNTER — Telehealth: Payer: Self-pay | Admitting: Family Medicine

## 2020-12-30 NOTE — Telephone Encounter (Signed)
If she has never had hep b or hep a vaccine and is traveling, I would recommend getting them. She can schedule RN visit

## 2020-12-30 NOTE — Telephone Encounter (Signed)
Pt has had a Tetanus injection in 2019, so she is covered on that one.  Please advise about the other 2, thank you.

## 2020-12-30 NOTE — Telephone Encounter (Signed)
Looking at her [facility] record pt had a Hep A, 01/31/1957 and B 1956-07-30.

## 2020-12-30 NOTE — Telephone Encounter (Signed)
I spoke with pt and she isn't sure if she received the Hep B or A, which she wanted to know if it would effect her if she had already had them but get them again.  Please advise.

## 2020-12-30 NOTE — Telephone Encounter (Signed)
Pt is going on a trip to [place] in July. Pt is wondering if she needs a Hept A or B, or a Tetanus shot for this trip. She was reading what the CDC is recommending. Please advise pt at [phone].

## 2021-01-03 NOTE — Telephone Encounter (Signed)
Hep A is a 2 shot series so sounds like she would be due for #2 We could do labs to check for Hep B immunity. If she is not immune, then she'll need addition Hep B vaccine. If she wants to skip the labs and come in for 1 Hep A vaccine and 1 Hep B vaccine, I think that would be fine. I will leave it up to the patient

## 2021-01-04 ENCOUNTER — Ambulatory Visit (INDEPENDENT_AMBULATORY_CARE_PROVIDER_SITE_OTHER): Payer: 59

## 2021-01-04 ENCOUNTER — Other Ambulatory Visit: Payer: Self-pay

## 2021-01-04 DIAGNOSIS — Z23 Encounter for immunization: Secondary | ICD-10-CM

## 2021-01-04 NOTE — Progress Notes (Signed)
Per orders of Dr. Barron Alvine pt is here for Hep A and Hep B vaccines, pt received Hep A vaccine in Left deltoid, Hep B vaccine in right deltoid, given by [place] CMA/CPT,  pt tolerated immunizations  well. No signs of reaction at either site of injection.

## 2021-01-07 ENCOUNTER — Ambulatory Visit (INDEPENDENT_AMBULATORY_CARE_PROVIDER_SITE_OTHER): Payer: 59 | Admitting: Family Medicine

## 2021-01-07 ENCOUNTER — Other Ambulatory Visit: Payer: Self-pay

## 2021-01-07 ENCOUNTER — Encounter: Payer: Self-pay | Admitting: Family Medicine

## 2021-01-07 VITALS — BP 120/86 | HR 81 | Temp 97.7°F | Ht 63.0 in | Wt 165.2 lb

## 2021-01-07 DIAGNOSIS — E785 Hyperlipidemia, unspecified: Secondary | ICD-10-CM

## 2021-01-07 DIAGNOSIS — R0789 Other chest pain: Secondary | ICD-10-CM

## 2021-01-07 NOTE — Progress Notes (Signed)
Tiffany Vargas is a 65 y.o. female  Chief Complaint  Patient presents with   Shoulder Pain    Pt c/o lt shoulder pain going to her upper back x 1 night.  Pt called 911 last night, she was evaluated by EMS .  Pt taking Ibuprofen around the clock w some relief.    HPI: Tiffany Vargas is a 65 y.o. female patient  Pt started with Lt shoulder pain and Lt upper back pain that started after she got out of the shower last night. No specific injury or trauma.  No SOB, diaphoresis, CP.  EMS called - rhythm strip done and WNL. VS - 150/98, 96, 98% Once EMS arrived, pt states she started to feel more anxious and felt mild nausea and lighthead Ibuprofen has helped.   Walks 1-2 miles qAM with dog  The 10-year ASCVD risk score Tiffany George DC Jr., et al., 2013) is: 6.8%   Values used to calculate the score:     Age: 34 years     Sex: Female     Is Non-Hispanic African American: No     Diabetic: No     Tobacco smoker: No     Systolic Blood Pressure: 120 mmHg     Is BP treated: Yes     HDL Cholesterol: 46.2 mg/dL     Total Cholesterol: 257 mg/dL  Lab Results  Component Value Date   CHOL 257 (H) 01/12/2021   HDL 46.20 01/12/2021   LDLCALC 171 (H) 01/12/2021   LDLDIRECT 138.0 03/02/2020   TRIG 198.0 (H) 01/12/2021   CHOLHDL 6 01/12/2021     Past Medical History:  Diagnosis Date   Cataract    OS; Dr Lisette Grinder , [facility]   Elevated cholesterol    Hypertension     Past Surgical History:  Procedure Laterality Date   CATARACT EXTRACTION  06/20/2012   right/LEFT eye, lens implant , Dr Lisette Grinder , [facility]   CESAREAN SECTION  1995   COLONOSCOPY  2009   Negative   DILATION AND CURETTAGE OF UTERUS  1999   Miscarriage   DORSAL COMPARTMENT RELEASE Right 01/29/2014   Procedure: RIGHT  FIRST DORSAL COMPARTMENT RELEASE (DEQUERVAIN);  Surgeon: Wyn Forster, MD;  Location: [facility];  Service: Orthopedics;  Laterality: Right;   LASIK  08/2003   TONSILLECTOMY AND ADENOIDECTOMY  1964     Social History   Socioeconomic History   Marital status: Widowed    Spouse name: Not on file   Number of children: Not on file   Years of education: Not on file   Highest education level: Not on file  Occupational History   Not on file  Tobacco Use   Smoking status: Never   Smokeless tobacco: Never  Vaping Use   Vaping Use: Never used  Substance and Sexual Activity   Alcohol use: Yes    Alcohol/week: 6.0 standard drinks    Types: 6 Glasses of wine per week    Comment: occasional  wine or liquor   Drug use: No   Sexual activity: Not Currently    Comment: Pt declined sexual hx questions  Other Topics Concern   Not on file  Social History Narrative   Exercise; walking      accountant   Social Determinants of Health   Financial Resource Strain: Not on file  Food Insecurity: Not on file  Transportation Needs: Not on file  Physical Activity: Not on file  Stress: Not on file  Social Connections: Not on file  Intimate Partner Violence: Not on file    Family History  Problem Relation Age of Onset   Hypertension Mother    Leukemia Mother        maternal family from [place]   Diabetes Mother        diet & oral meds   Dementia Mother 36   Osteoporosis Mother    Heart failure Father        CHF   Cataracts Father    Breast cancer Maternal Grandmother 19   Cataracts Sister    Cataracts Sister    Leukemia Maternal Uncle    Leukemia Maternal Grandfather    Stroke Neg Hx      Immunization History  Administered Date(s) Administered   Hepatitis A, Adult 01/04/2021   Hepb-cpg 01/04/2021   Influenza Inj Mdck Quad Pf 05/08/2018   Influenza Whole 05/14/2008, 05/14/2012   Influenza,inj,Quad PF,6+ Mos 06/13/2016, 05/12/2017, 04/22/2019   Influenza-Unspecified 04/22/2019   Td 04/05/2006   Tdap 09/24/2017   Zoster Recombinat (Shingrix) 10/09/2017, 12/11/2017    Outpatient Encounter Medications as of 01/07/2021  Medication Sig   benazepril (LOTENSIN) 10 MG tablet  Take 1 tablet (10 mg total) by mouth daily. **Needs appt before anymore refills given.**   cetirizine (ZYRTEC) 10 MG tablet Take 10 mg by mouth daily.   estradiol (ESTRACE VAGINAL) 0.1 MG/GM vaginal cream Apply 0.5 - 1 gram of cream vaginally nightly x 2 weeks, then every other night x 2 weeks, then twice weekly after that   Flaxseed, Linseed, 1000 MG CAPS Take by mouth.   ibuprofen (ADVIL) 200 MG tablet Take 200 mg by mouth every 6 (six) hours as needed.   Multiple Vitamins-Minerals (MULTIVITAMIN PO) Take 1 tablet by mouth daily.    Omega-3 Fatty Acids (FISH OIL) 1000 MG CAPS Take by mouth.   triamcinolone cream (KENALOG) 0.1 % Apply 1 application topically 2 (two) times daily.   No facility-administered encounter medications on file as of 01/07/2021.     ROS: Pertinent positives and negatives noted in HPI. Remainder of ROS non-contributory   Allergies  Allergen Reactions   Hydrocodone Nausea And Vomiting and Other (See Comments)    Disoriented   Other Diarrhea    Peanuts and Peanut butter   Latex Hives and Rash    BP 120/86 (BP Location: Left Arm, Patient Position: Sitting, Cuff Size: Normal)   Pulse 81   Temp 97.7 F (36.5 C) (Temporal)   Ht 5\' 3"  (1.6 m)   Wt 165 lb 3.2 oz (74.9 kg)   LMP 03/06/2014   SpO2 98%   BMI 29.26 kg/m  Wt Readings from Last 3 Encounters:  01/07/21 165 lb 3.2 oz (74.9 kg)  07/22/20 163 lb (73.9 kg)  02/25/20 162 lb (73.5 kg)   Temp Readings from Last 3 Encounters:  01/07/21 97.7 F (36.5 C) (Temporal)  02/25/20 98.6 F (37 C) (Temporal)  01/14/20 98.2 F (36.8 C) (Temporal)   BP Readings from Last 3 Encounters:  01/07/21 120/86  07/22/20 124/80  02/25/20 110/78   Pulse Readings from Last 3 Encounters:  01/07/21 81  02/25/20 81  01/14/20 85    Physical Exam Constitutional:      General: She is not in acute distress.    Appearance: Normal appearance. She is not ill-appearing.  Cardiovascular:     Rate and Rhythm: Normal rate  and regular rhythm.     Pulses: Normal pulses.     Heart sounds: Normal heart  sounds.  Pulmonary:     Effort: Pulmonary effort is normal.     Breath sounds: Normal breath sounds. No wheezing or rhonchi.  Chest:     Chest wall: Tenderness present.    Musculoskeletal:     Cervical back: Muscular tenderness present. Normal range of motion (normal ROM but pt reports soreness with ROM at neck).     Right lower leg: No edema.     Left lower leg: No edema.  Neurological:     Mental Status: She is alert and oriented to person, place, and time.  Psychiatric:        Mood and Affect: Mood is anxious.     A/P:  1. Hyperlipidemia, unspecified hyperlipidemia type - not currently on a statin but was in the past - Lipid panel; Future  2. Other chest pain 3. Musculoskeletal chest pain - sharp chest pain, no associated symptoms - normal rhythm strip done by EMS - symptoms improving with ibuprofen - very much doubt cardiac etiology - cont with NSAIDs w/ food, heating pad, stretching exercises - f/u if symptoms return, worsen, or new ones develop  I spent 40 min with the patient today discussing symptoms, performing physical exam, discussing possible etiologies and treatment plan.  This visit occurred during the SARS-CoV-2 public health emergency.  Safety protocols were in place, including screening questions prior to the visit, additional usage of staff PPE, and extensive cleaning of exam room while observing appropriate contact time as indicated for disinfecting solutions.

## 2021-01-12 ENCOUNTER — Other Ambulatory Visit: Payer: Self-pay

## 2021-01-12 ENCOUNTER — Other Ambulatory Visit (INDEPENDENT_AMBULATORY_CARE_PROVIDER_SITE_OTHER): Payer: 59

## 2021-01-12 DIAGNOSIS — E785 Hyperlipidemia, unspecified: Secondary | ICD-10-CM | POA: Diagnosis not present

## 2021-01-12 LAB — LIPID PANEL
Cholesterol: 257 mg/dL — ABNORMAL HIGH (ref 0–200)
HDL: 46.2 mg/dL (ref >39.00)
LDL Cholesterol: 171 mg/dL — ABNORMAL HIGH (ref 0–99)
NonHDL: 210.65
Total CHOL/HDL Ratio: 6
Triglycerides: 198 mg/dL — ABNORMAL HIGH (ref 0.0–149.0)
VLDL: 39.6 mg/dL (ref 0.0–40.0)

## 2021-01-12 NOTE — Progress Notes (Signed)
Per orders of dr. Cirigliano pt is here for labs pt tolerated draw well.  

## 2021-01-19 ENCOUNTER — Telehealth: Payer: Self-pay

## 2021-01-19 NOTE — Telephone Encounter (Signed)
Pt calling in regards to her lab results.  Pt would like to know if provider prefers for pt to come back for an office visit or if provider is ok with sending a Mychart message.  Please advise.

## 2021-01-25 ENCOUNTER — Encounter: Payer: Self-pay | Admitting: Family Medicine

## 2021-01-25 DIAGNOSIS — E785 Hyperlipidemia, unspecified: Secondary | ICD-10-CM

## 2021-01-25 NOTE — Telephone Encounter (Signed)
Please see message and advise.  Thank you. ° °

## 2021-01-26 MED ORDER — ATORVASTATIN CALCIUM 20 MG PO TABS: 20.0000 mg | ORAL_TABLET | Freq: Every day | ORAL | 3 refills | Status: DC

## 2021-01-26 NOTE — Telephone Encounter (Signed)
Mychart message was sent.

## 2021-03-31 ENCOUNTER — Other Ambulatory Visit: Payer: Self-pay | Admitting: Nurse Practitioner

## 2021-03-31 DIAGNOSIS — I10 Essential (primary) hypertension: Secondary | ICD-10-CM

## 2021-03-31 NOTE — Telephone Encounter (Signed)
Dr Renaye Rakers pt requesting blood pressure Rx refill, benazepril (LOTENSIN) 10 MG tablet [[id number]]   TOC scheduled with Claris Gower in Oct.

## 2021-04-01 MED ORDER — BENAZEPRIL HCL 10 MG PO TABS: 10.0000 mg | ORAL_TABLET | Freq: Every day | ORAL | 0 refills | Status: DC

## 2021-04-01 NOTE — Telephone Encounter (Signed)
Patient notified and verbalized understanding. 

## 2021-04-01 NOTE — Telephone Encounter (Signed)
Last refill stated she needed an appointment before additional refills but she already had an appointment following that comment, so can we go ahead and fill medication? Pt is scheduled to see you for a TOC in 05/2021. Please advise

## 2021-04-16 IMAGING — US LEFT UPPER EXTREMITY SOFT TISSUE ULTRASOUND LIMITED
1 series · 13 of 15 positions shown · non-contrast
Comparison: Opposite thumb for comparison

CLINICAL DATA: Left thumb mass.

EXAM:
ULTRASOUND LEFT UPPER EXTREMITY LIMITED
TECHNIQUE: Ultrasound examination of the upper extremity soft tissues was
performed in the area of clinical concern. I compared the area of
concern with the same area on the right thumb.

[Series 1: left upper extremity soft tissue ultrasound limite · 0.06mm/px · 15 acquisitions, 13 frames shown]
[im 1/15]
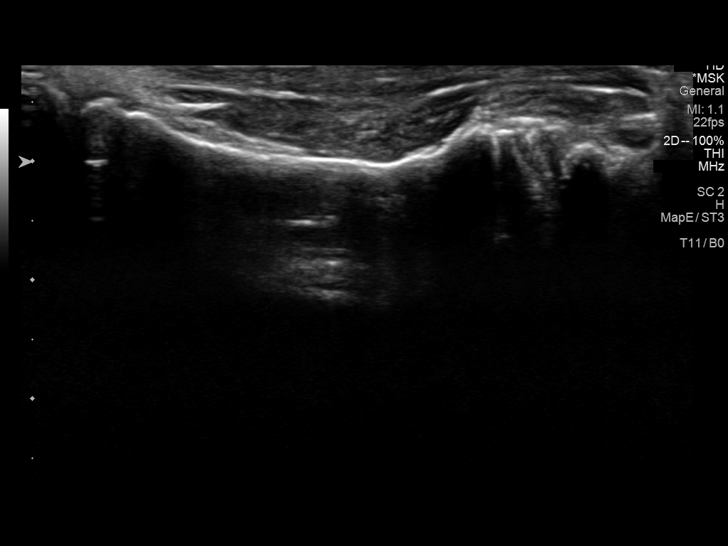
[im 2/15]
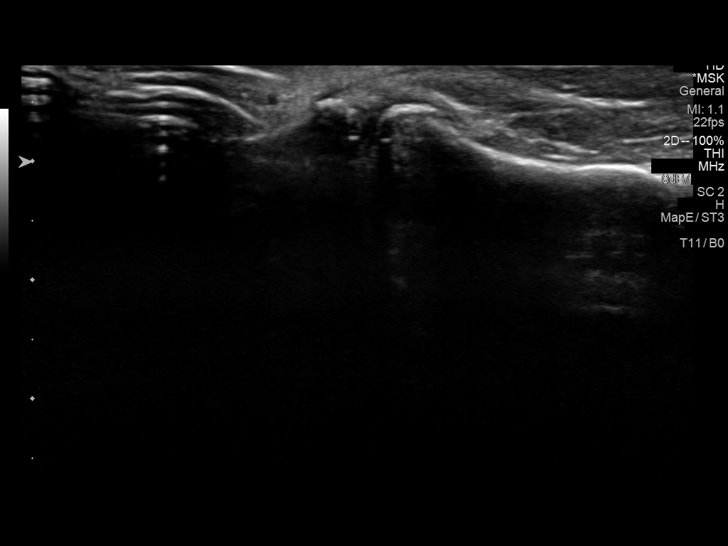
[im 3/15]
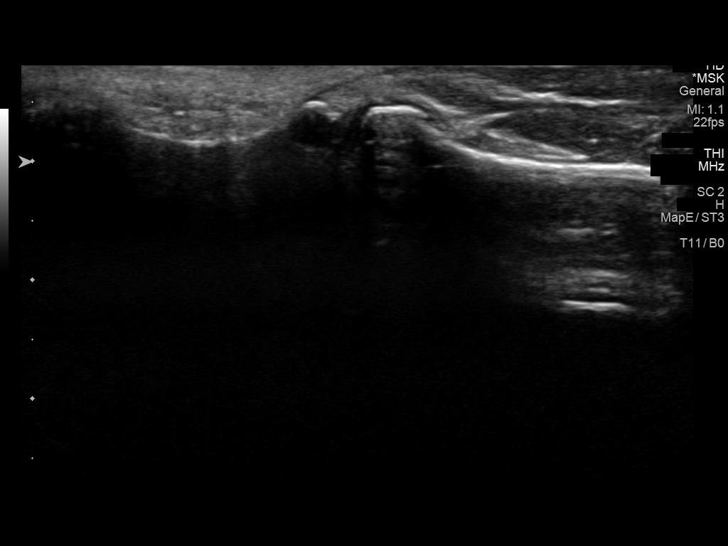
[im 5/15]
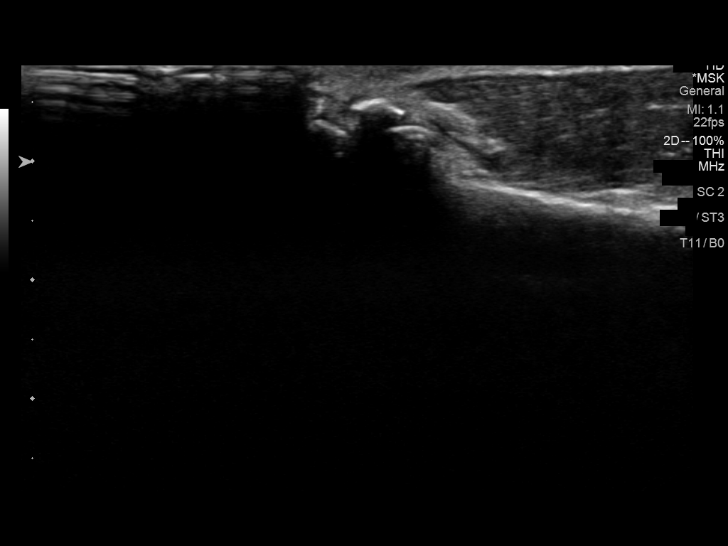
[im 6/15]
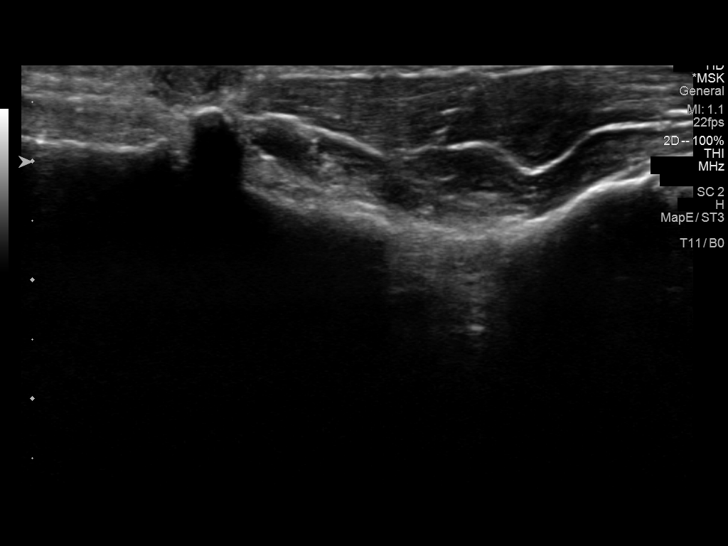
[im 7/15]
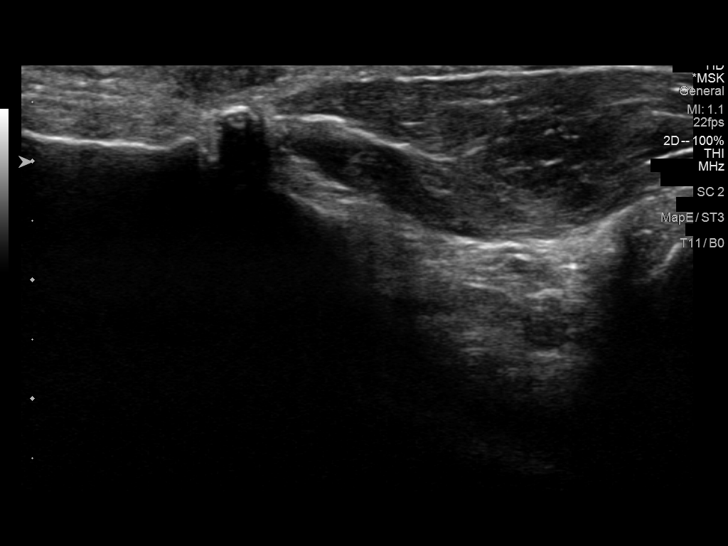
[im 8/15]
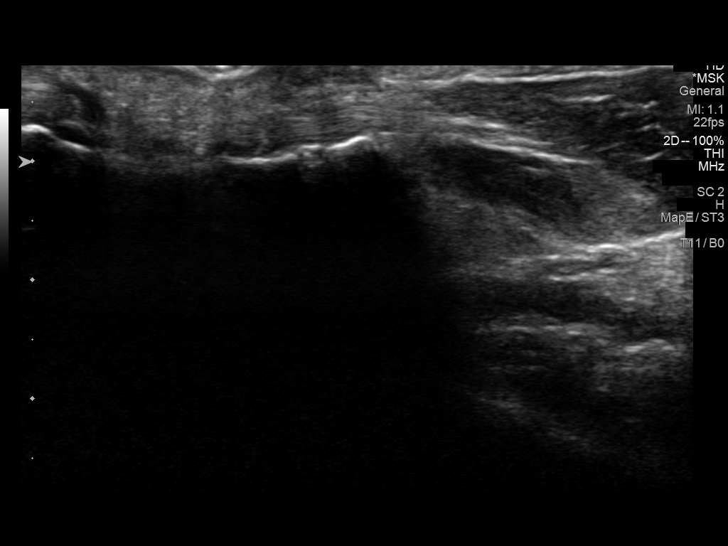
[im 9/15]
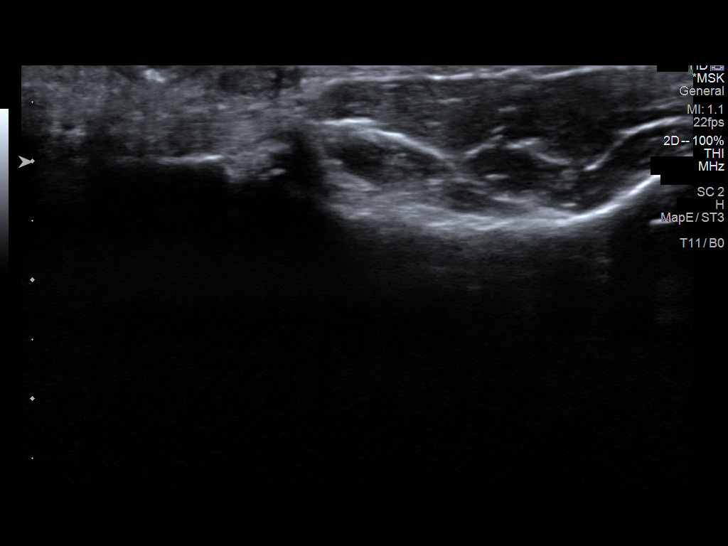
[im 10/15]
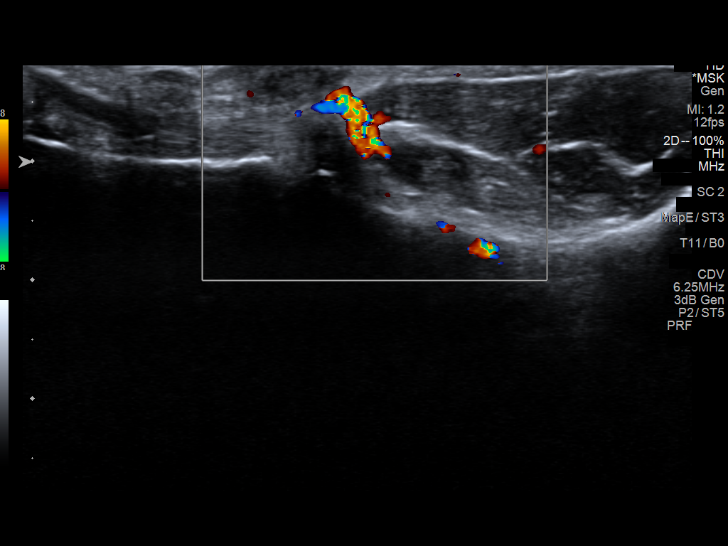
[im 11/15]
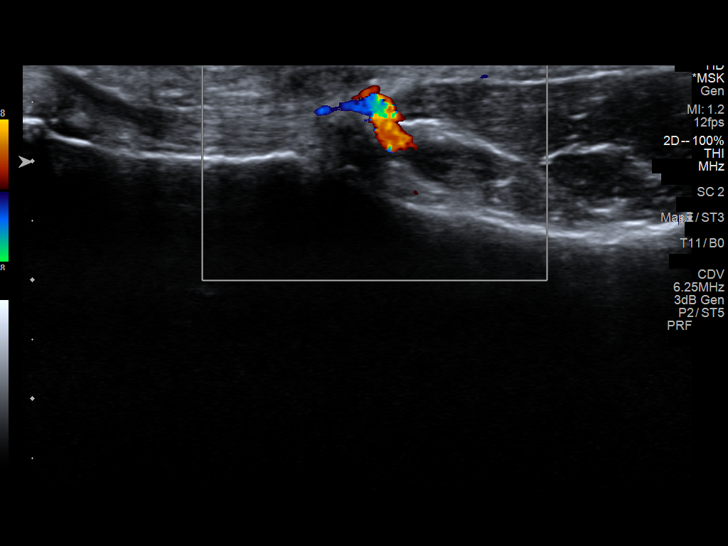
[im 13/15]
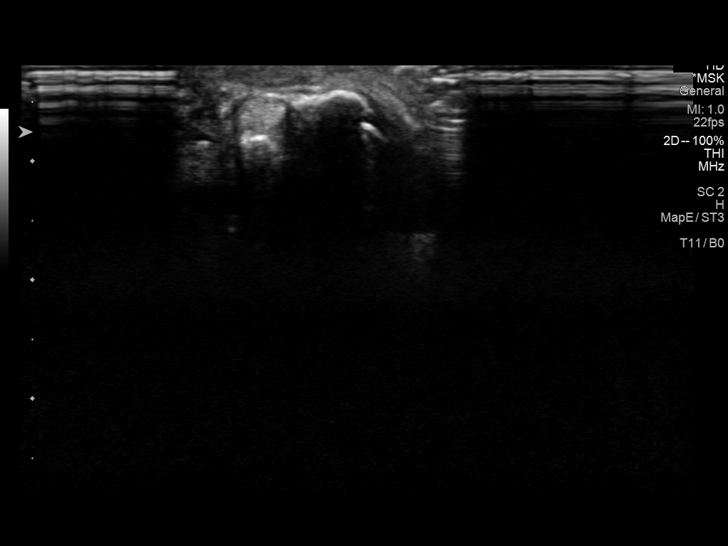
[im 14/15]
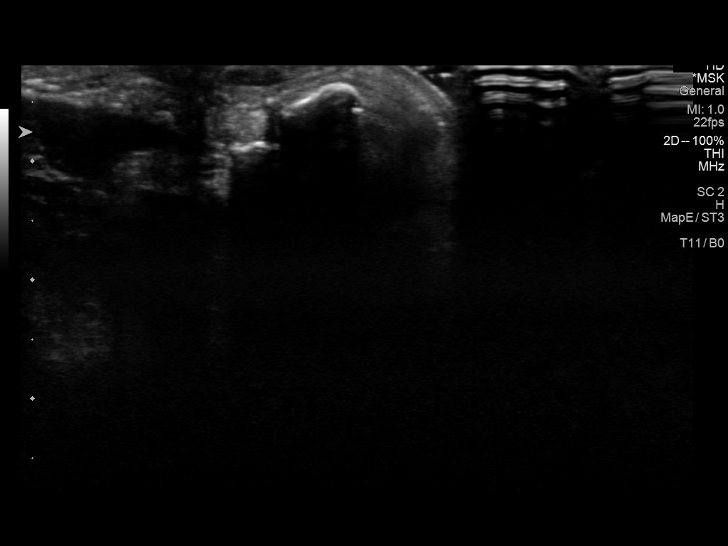
[im 15/15]
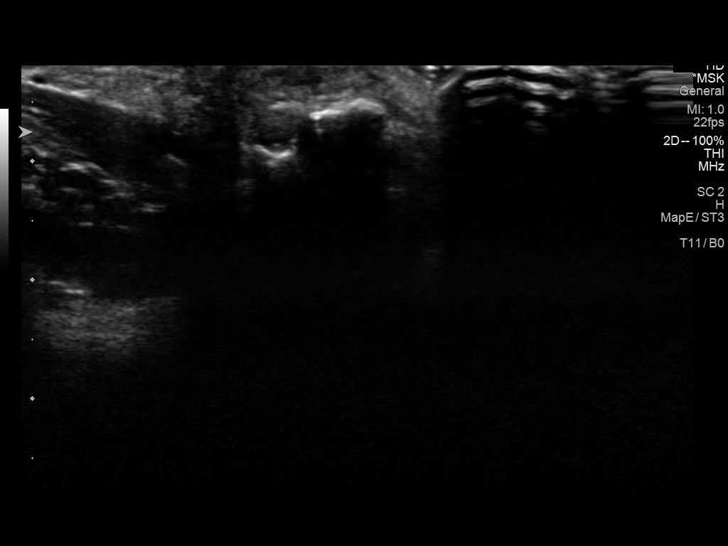

[13 of 15 positions shown; findings below may reference images not displayed]

FINDINGS: The patient has an accessory ossicle at the first metacarpal
phalangeal joint. There is a vague focal area of hypoechogenicity in
the overlying subcutaneous fat which is better defined on
longitudinal images but is poorly defined on the transverse images.
This does not represent a ganglion cyst. I suspect it represents a
focal area of chronic scarring or inflammation in the subcutaneous
fat of no consequence. There is no abnormal perfusion to this area.

There is no joint effusion or ganglion cyst. The flexor tendons of
the thumb appear normal.

Comparison with the opposite side demonstrates that the patient does
have an accessory ossicle at the first MCP joint but does not have
the overlying abnormality in the subcutaneous fat.
IMPRESSION: 1. The palpable abnormality represents what I think represents an
area of scarring or chronic inflammation of the subcutaneous fat
overlying an accessory ossicle at the first MCP joint.
2. No other abnormality.

## 2021-04-24 ENCOUNTER — Other Ambulatory Visit: Payer: Self-pay | Admitting: Nurse Practitioner

## 2021-04-24 DIAGNOSIS — I10 Essential (primary) hypertension: Secondary | ICD-10-CM

## 2021-06-08 ENCOUNTER — Emergency Department (HOSPITAL_BASED_OUTPATIENT_CLINIC_OR_DEPARTMENT_OTHER)
Admission: EM | Admit: 2021-06-08 | Discharge: 2021-06-08 | Disposition: A | Discharge status: Home / Self Care | Payer: 59 | Attending: Emergency Medicine | Admitting: Emergency Medicine

## 2021-06-08 ENCOUNTER — Other Ambulatory Visit: Payer: Self-pay

## 2021-06-08 ENCOUNTER — Encounter: Payer: Self-pay | Admitting: Nurse Practitioner

## 2021-06-08 ENCOUNTER — Encounter (HOSPITAL_BASED_OUTPATIENT_CLINIC_OR_DEPARTMENT_OTHER): Payer: Self-pay | Admitting: *Deleted

## 2021-06-08 ENCOUNTER — Ambulatory Visit: Payer: 59 | Admitting: Nurse Practitioner

## 2021-06-08 VITALS — BP 120/70 | HR 88 | Temp 98.4°F | Ht 63.25 in | Wt 163.6 lb

## 2021-06-08 DIAGNOSIS — I10 Essential (primary) hypertension: Secondary | ICD-10-CM

## 2021-06-08 DIAGNOSIS — Z79899 Other long term (current) drug therapy: Secondary | ICD-10-CM | POA: Diagnosis not present

## 2021-06-08 DIAGNOSIS — Z1211 Encounter for screening for malignant neoplasm of colon: Secondary | ICD-10-CM | POA: Diagnosis not present

## 2021-06-08 DIAGNOSIS — R739 Hyperglycemia, unspecified: Secondary | ICD-10-CM

## 2021-06-08 DIAGNOSIS — Z0184 Encounter for antibody response examination: Secondary | ICD-10-CM | POA: Diagnosis not present

## 2021-06-08 DIAGNOSIS — H1132 Conjunctival hemorrhage, left eye: Secondary | ICD-10-CM | POA: Diagnosis not present

## 2021-06-08 DIAGNOSIS — H5789 Other specified disorders of eye and adnexa: Secondary | ICD-10-CM

## 2021-06-08 DIAGNOSIS — Z91010 Allergy to peanuts: Secondary | ICD-10-CM | POA: Diagnosis not present

## 2021-06-08 DIAGNOSIS — H538 Other visual disturbances: Secondary | ICD-10-CM | POA: Diagnosis present

## 2021-06-08 DIAGNOSIS — Z91040 Latex allergy status: Secondary | ICD-10-CM | POA: Insufficient documentation

## 2021-06-08 DIAGNOSIS — Z87440 Personal history of urinary (tract) infections: Secondary | ICD-10-CM

## 2021-06-08 DIAGNOSIS — E785 Hyperlipidemia, unspecified: Secondary | ICD-10-CM

## 2021-06-08 LAB — BASIC METABOLIC PANEL
BUN: 16 mg/dL (ref 6–23)
CO2: 29 mEq/L (ref 19–32)
Calcium: 9.7 mg/dL (ref 8.4–10.5)
Chloride: 105 mEq/L (ref 96–112)
Creatinine, Ser: 0.71 mg/dL (ref 0.40–1.20)
GFR: 89.9 mL/min (ref >60.00)
Glucose, Bld: 107 mg/dL — ABNORMAL HIGH (ref 70–99)
Potassium: 5.3 mEq/L — ABNORMAL HIGH (ref 3.5–5.1)
Sodium: 142 mEq/L (ref 135–145)

## 2021-06-08 LAB — LIPID PANEL
Cholesterol: 225 mg/dL — ABNORMAL HIGH (ref 0–200)
HDL: 51 mg/dL (ref >39.00)
LDL Cholesterol: 144 mg/dL — ABNORMAL HIGH (ref 0–99)
NonHDL: 173.89
Total CHOL/HDL Ratio: 4
Triglycerides: 149 mg/dL (ref 0.0–149.0)
VLDL: 29.8 mg/dL (ref 0.0–40.0)

## 2021-06-08 LAB — HEMOGLOBIN A1C: Hgb A1c MFr Bld: 5.7 % (ref 4.6–6.5)

## 2021-06-08 MED ORDER — NITROFURANTOIN MONOHYD MACRO 100 MG PO CAPS: 100.0000 mg | ORAL_CAPSULE | Freq: Two times a day (BID) | ORAL | 0 refills | Status: AC

## 2021-06-08 MED ORDER — FLUORESCEIN SODIUM 1 MG OP STRP
1.0000 | ORAL_STRIP | Freq: Once | OPHTHALMIC | Status: AC
  Administered 2021-06-08: 1 via OPHTHALMIC
  Filled 2021-06-08: qty 1

## 2021-06-08 MED ORDER — TETRACAINE HCL 0.5 % OP SOLN
1.0000 [drp] | Freq: Once | OPHTHALMIC | Status: AC
  Administered 2021-06-08: 2 [drp] via OPHTHALMIC
  Filled 2021-06-08: qty 4

## 2021-06-08 MED ORDER — ERYTHROMYCIN 5 MG/GM OP OINT
TOPICAL_OINTMENT | Freq: Once | OPHTHALMIC | Status: AC
  Filled 2021-06-08: qty 3.5

## 2021-06-08 MED ORDER — ERYTHROMYCIN 5 MG/GM OP OINT: TOPICAL_OINTMENT | OPHTHALMIC | 0 refills | Status: DC

## 2021-06-08 MED ORDER — BENAZEPRIL HCL 10 MG PO TABS: 10.0000 mg | ORAL_TABLET | Freq: Every day | ORAL | 3 refills | Status: DC

## 2021-06-08 NOTE — Assessment & Plan Note (Signed)
Developed muscle aches with atorvastatin x 1week. Reports previous use of simvastatin with no adverse side effects, d/c due to improved lipid panel through diet and exercise. No tobacco use, no DM, no FHx of premature CAD/CVA, no known CAD/PAD. Based on last lipid panel 01/2021, her ASCVD risk for the next 74yrs is 7.4%. Will repeat lipid panel today and calculate risk again

## 2021-06-08 NOTE — Progress Notes (Signed)
Subjective:  Patient ID: Tiffany Vargas, female    DOB: November 08, 1955  Age: 65 y.o. MRN: [id number]  CC: Establish Care (TOC-Dr. Jerilynn Mages. Cirigliano/Pt in need of refill for HTN medication. )  HPI  Hyperlipidemia Developed muscle aches with atorvastatin x 1week. Reports previous use of simvastatin with no adverse side effects, d/c due to improved lipid panel through diet and exercise. No tobacco use, no DM, no FHx of premature CAD/CVA, no known CAD/PAD. Based on last lipid panel 01/2021, her ASCVD risk for the next 62yrs is 7.4%. Will repeat lipid panel today and calculate risk again  Essential hypertension BP at goal with benazepril BP Readings from Last 3 Encounters:  06/08/21 120/70  01/07/21 120/86  07/22/20 124/80   Refill sent Repeat BMP  She is due for repeat cologuard(last completed 2019-negative), denies any change in GI function. She agreed to order.  She is travelling to [place] in 3weeks. She is requesting for MMR immunity test. No previous vaccination, reports previous infection as a child. She is aware test may not be covered by insurance. She also reports recurrent UTI during international travel. She is requesting oral antibiotic to have at hand.  Reviewed past Medical, Social and Family history today.  Outpatient Medications Prior to Visit  Medication Sig Dispense Refill   cetirizine (ZYRTEC) 10 MG tablet Take 10 mg by mouth daily.     ibuprofen (ADVIL) 200 MG tablet Take 200 mg by mouth every 6 (six) hours as needed.     Multiple Vitamins-Minerals (MULTIVITAMIN PO) Take 1 tablet by mouth daily.      Omega-3 Fatty Acids (FISH OIL) 1000 MG CAPS Take by mouth.     benazepril (LOTENSIN) 10 MG tablet Take 1 tablet (10 mg total) by mouth daily. No additional refills without office visit 60 tablet 0   atorvastatin (LIPITOR) 20 MG tablet Take 1 tablet (20 mg total) by mouth daily. 90 tablet 3   estradiol (ESTRACE VAGINAL) 0.1 MG/GM vaginal cream Apply 0.5 - 1 gram of cream  vaginally nightly x 2 weeks, then every other night x 2 weeks, then twice weekly after that 42.5 g 12   Flaxseed, Linseed, 1000 MG CAPS Take by mouth.     triamcinolone cream (KENALOG) 0.1 % Apply 1 application topically 2 (two) times daily. 30 g 0   No facility-administered medications prior to visit.    ROS See HPI  Objective:  BP 120/70 (BP Location: Left Arm, Patient Position: Sitting, Cuff Size: Large)   Pulse 88   Temp 98.4 F (36.9 C) (Temporal)   Ht 5' 3.25" (1.607 m)   Wt 163 lb 9.6 oz (74.2 kg)   LMP 03/06/2014   SpO2 98%   BMI 28.75 kg/m   Physical Exam Cardiovascular:     Rate and Rhythm: Normal rate and regular rhythm.     Pulses: Normal pulses.     Heart sounds: Normal heart sounds.  Pulmonary:     Effort: Pulmonary effort is normal.     Breath sounds: Normal breath sounds.  Abdominal:     General: Bowel sounds are normal.     Palpations: Abdomen is soft.  Musculoskeletal:     Right lower leg: No edema.     Left lower leg: No edema.  Neurological:     Mental Status: She is alert and oriented to person, place, and time.  Psychiatric:        Mood and Affect: Mood normal.        Behavior:  Behavior normal.        Thought Content: Thought content normal.    Assessment & Plan:  This visit occurred during the SARS-CoV-2 public health emergency.  Safety protocols were in place, including screening questions prior to the visit, additional usage of staff PPE, and extensive cleaning of exam room while observing appropriate contact time as indicated for disinfecting solutions.   [name] was seen today for establish care.  Diagnoses and all orders for this visit:  Hyperlipidemia, unspecified hyperlipidemia type -     Lipid panel  Immunity to measles, mumps, and rubella determined by serologic test -     Measles/Mumps/Rubella Immunity  Colon cancer screening -     Cologuard; Future  Essential hypertension -     Basic metabolic panel -     benazepril  (LOTENSIN) 10 MG tablet; Take 1 tablet (10 mg total) by mouth daily.  History of recurrent UTI (urinary tract infection) -     nitrofurantoin, macrocrystal-monohydrate, (MACROBID) 100 MG capsule; Take 1 capsule (100 mg total) by mouth 2 (two) times daily for 5 days.  Hyperglycemia -     Hemoglobin A1c   Problem List Items Addressed This Visit       Cardiovascular and Mediastinum   Essential hypertension    BP at goal with benazepril BP Readings from Last 3 Encounters:  06/08/21 120/70  01/07/21 120/86  07/22/20 124/80   Refill sent Repeat BMP      Relevant Medications   benazepril (LOTENSIN) 10 MG tablet   Other Relevant Orders   Basic metabolic panel (Completed)     Other   Hyperglycemia   Relevant Orders   Hemoglobin A1c (Completed)   Hyperlipidemia - Primary    Developed muscle aches with atorvastatin x 1week. Reports previous use of simvastatin with no adverse side effects, d/c due to improved lipid panel through diet and exercise. No tobacco use, no DM, no FHx of premature CAD/CVA, no known CAD/PAD. Based on last lipid panel 01/2021, her ASCVD risk for the next 72yrs is 7.4%. Will repeat lipid panel today and calculate risk again      Relevant Medications   benazepril (LOTENSIN) 10 MG tablet   Other Relevant Orders   Lipid panel (Completed)   Other Visit Diagnoses     Immunity to measles, mumps, and rubella determined by serologic test       Relevant Orders   Measles/Mumps/Rubella Immunity   Colon cancer screening       Relevant Orders   Cologuard   History of recurrent UTI (urinary tract infection)       Relevant Medications   nitrofurantoin, macrocrystal-monohydrate, (MACROBID) 100 MG capsule       Follow-up: Return in about 6 months (around 12/07/2021) for HTN and , hyperlipidemia.  Wilfred Lacy, NP

## 2021-06-08 NOTE — ED Notes (Signed)
ED Provider at bedside. 

## 2021-06-08 NOTE — Assessment & Plan Note (Signed)
BP at goal with benazepril BP Readings from Last 3 Encounters:  06/08/21 120/70  01/07/21 120/86  07/22/20 124/80   Refill sent Repeat BMP

## 2021-06-08 NOTE — Patient Instructions (Addendum)
Proanthcyanidin '38mg'$  or  D-Mannose powder 2g or Uqora probiotics or Feminine balance probiotics  Go to lab for blood draw  Complete cologuard kit.  Go to lab for blood draw  Have a safe trip  Urinary Tract Infection, Adult A urinary tract infection (UTI) is an infection of any part of the urinary tract. The urinary tract includes the kidneys, ureters, bladder, and urethra. These organs make, store, and get rid of urine in the body. An upper UTI affects the ureters and kidneys. A lower UTI affects the bladder and urethra. What are the causes? Most urinary tract infections are caused by bacteria in your genital area around your urethra, where urine leaves your body. These bacteria grow and cause inflammation of your urinary tract. What increases the risk? You are more likely to develop this condition if: You have a urinary catheter that stays in place. You are not able to control when you urinate or have a bowel movement (incontinence). You are female and you: Use a spermicide or diaphragm for birth control. Have low estrogen levels. Are pregnant. You have certain genes that increase your risk. You are sexually active. You take antibiotic medicines. You have a condition that causes your flow of urine to slow down, such as: An enlarged prostate, if you are female. Blockage in your urethra. A kidney stone. A nerve condition that affects your bladder control (neurogenic bladder). Not getting enough to drink, or not urinating often. You have certain medical conditions, such as: Diabetes. A weak disease-fighting system (immunesystem). Sickle cell disease. Gout. Spinal cord injury. What are the signs or symptoms? Symptoms of this condition include: Needing to urinate right away (urgency). Frequent urination. This may include small amounts of urine each time you urinate. Pain or burning with urination. Blood in the urine. Urine that smells bad or unusual. Trouble  urinating. Cloudy urine. Vaginal discharge, if you are female. Pain in the abdomen or the lower back. You may also have: Vomiting or a decreased appetite. Confusion. Irritability or tiredness. A fever or chills. Diarrhea. The first symptom in older adults may be confusion. In some cases, they may not have any symptoms until the infection has worsened. How is this diagnosed? This condition is diagnosed based on your medical history and a physical exam. You may also have other tests, including: Urine tests. Blood tests. Tests for STIs (sexually transmitted infections). If you have had more than one UTI, a cystoscopy or imaging studies may be done to determine the cause of the infections. How is this treated? Treatment for this condition includes: Antibiotic medicine. Over-the-counter medicines to treat discomfort. Drinking enough water to stay hydrated. If you have frequent infections or have other conditions such as a kidney stone, you may need to see a health care provider who specializes in the urinary tract (urologist). In rare cases, urinary tract infections can cause sepsis. Sepsis is a life-threatening condition that occurs when the body responds to an infection. Sepsis is treated in the hospital with IV antibiotics, fluids, and other medicines. Follow these instructions at home: Medicines Take over-the-counter and prescription medicines only as told by your health care provider. If you were prescribed an antibiotic medicine, take it as told by your health care provider. Do not stop using the antibiotic even if you start to feel better. General instructions Make sure you: Empty your bladder often and completely. Do not hold urine for long periods of time. Empty your bladder after sex. Wipe from front to back after urinating or  having a bowel movement if you are female. Use each tissue only one time when you wipe. Drink enough fluid to keep your urine pale yellow. Keep all  follow-up visits. This is important. Contact a health care provider if: Your symptoms do not get better after 1-2 days. Your symptoms go away and then return. Get help right away if: You have severe pain in your back or your lower abdomen. You have a fever or chills. You have nausea or vomiting. Summary A urinary tract infection (UTI) is an infection of any part of the urinary tract, which includes the kidneys, ureters, bladder, and urethra. Most urinary tract infections are caused by bacteria in your genital area. Treatment for this condition often includes antibiotic medicines. If you were prescribed an antibiotic medicine, take it as told by your health care provider. Do not stop using the antibiotic even if you start to feel better. Keep all follow-up visits. This is important. This information is not intended to replace advice given to you by your health care provider. Make sure you discuss any questions you have with your health care provider. Document Revised: 03/12/2020 Document Reviewed: 03/12/2020 Elsevier Patient Education  [place].

## 2021-06-08 NOTE — ED Triage Notes (Addendum)
C/o left eye redness and irritation x 1 day , seen by PMD this am. Increased redness  and ? Blister ? this pm

## 2021-06-08 NOTE — ED Provider Notes (Signed)
[facility] EMERGENCY DEPARTMENT Provider Note   CSN: [id number] Arrival date & time: 06/08/21  1857    History Chief Complaint  Patient presents with   Eye Problem    Tiffany Vargas is a 65 y.o. female with a past medical history who presents for evaluation of left eye irritation.  Started to have some generalized redness yesterday and some pruritus.  Noted some increased redness today at lateral aspect left eye  She denies scratching her eye, chemical or irritant exposure.  Denies foreign body.  She does not wear contacts.  Noticed a pin sized lesion to her right mucosa earlier today.  She denies any visual field changes.  No headache, paresthesias, weakness.  Denies additional aggravating or relieving factors.  Has not taken anything for symptoms.  History obtained from patient and past medical records.  No interpreter used.  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   Hyperlipidemia 11/14/2007   Essential hypertension 11/14/2007   MIGRAINES, HX OF 11/14/2007    Past Surgical History:  Procedure Laterality Date   CATARACT EXTRACTION  06/20/2012   right/LEFT eye, lens implant , Dr Lisette Grinder , [facility]   CESAREAN SECTION  1995   COLONOSCOPY  2009   Negative   DILATION AND CURETTAGE OF UTERUS  1999   Miscarriage   DORSAL COMPARTMENT RELEASE Right 01/29/2014   Procedure: RIGHT  FIRST DORSAL COMPARTMENT RELEASE (DEQUERVAIN);  Surgeon: Wyn Forster, MD;  Location: [facility];  Service: Orthopedics;  Laterality: Right;   LASIK  08/2003   TONSILLECTOMY AND ADENOIDECTOMY  1964      OB History     Gravida  2   Para  1   Term  1   Preterm      AB  1   Living  1      SAB      IAB      Ectopic      Multiple      Live Births              Family History  Problem Relation Age of Onset   Hypertension Mother    Leukemia Mother        maternal family from [place]   Diabetes Mother        diet & oral meds   Dementia Mother 41   Osteoporosis Mother    Heart failure Father        CHF   Cataracts Father    Breast cancer Maternal Grandmother 51   Cataracts Sister    Cataracts Sister    Leukemia Maternal Uncle    Leukemia Maternal Grandfather    Stroke Neg Hx     Social History   Tobacco Use   Smoking status: Never   Smokeless tobacco: Never  Vaping Use   Vaping Use: Never used  Substance Use Topics   Alcohol use: Yes    Alcohol/week: 6.0 standard drinks    Types: 6 Glasses of wine per week    Comment: occasional  wine  or liquor   Drug use: No    Home Medications Prior to Admission medications   Medication Sig Start Date End Date Taking? Authorizing Provider  erythromycin ophthalmic ointment Place a 1/2 inch ribbon of ointment into the lower eyelid. 06/08/21  Yes Paddy Walthall A, PA-C  benazepril (LOTENSIN) 10 MG tablet Take 1 tablet (10 mg total) by mouth daily. 06/08/21   Nche, Bonna Gains, NP  cetirizine (ZYRTEC) 10 MG tablet Take 10 mg by mouth daily.    [provider]  ibuprofen (ADVIL) 200 MG tablet Take 200 mg by mouth every 6 (six) hours as needed.    [provider]  Multiple Vitamins-Minerals (MULTIVITAMIN PO) Take 1 tablet by mouth daily.     [provider]  nitrofurantoin, macrocrystal-monohydrate, (MACROBID) 100 MG capsule Take 1 capsule (100 mg total) by mouth 2 (two) times daily for 5 days. 06/08/21 06/13/21  Nche, Bonna Gains, NP  Omega-3 Fatty Acids (FISH OIL) 1000 MG CAPS Take by mouth.    [provider]    Allergies    Hydrocodone, Other, Peanut-containing drug  products, and Latex  Review of Systems   Review of Systems  Constitutional: Negative.   HENT: Negative.    Eyes:  Positive for discharge (clear), redness and itching.  Respiratory: Negative.    Cardiovascular: Negative.   Gastrointestinal: Negative.   Genitourinary: Negative.   Musculoskeletal: Negative.   Skin: Negative.   Neurological: Negative.   All other systems reviewed and are negative.  Physical Exam Updated Vital Signs BP (!) 150/76 (BP Location: Left Arm)   Pulse (!) 46   Temp 99 F (37.2 C) (Oral)   Resp 18   Ht 5\' 3"  (1.6 m)   Wt 73.9 kg   LMP 03/06/2014   SpO2 93%   BMI 28.87 kg/m   Physical Exam Vitals and nursing note reviewed.  Constitutional:      General: She is not in acute distress.    Appearance: She is well-developed. She is not ill-appearing, toxic-appearing or diaphoretic.  HENT:     Head: Normocephalic and atraumatic.  Eyes:     General: Lids are everted, no foreign bodies appreciated. Vision grossly intact.        Right eye: No foreign body, discharge or hordeolum.        Left eye: Discharge (clear) present.No foreign body or hordeolum.     Extraocular Movements: Extraocular movements intact.     Right eye: Normal extraocular motion and no nystagmus.     Left eye: Normal extraocular motion and no nystagmus.     Conjunctiva/sclera:     Right eye: Right conjunctiva is not injected. No chemosis, exudate or hemorrhage.    Left eye: Left conjunctiva is injected. Hemorrhage present. No chemosis or exudate.    Pupils: Pupils are equal, round, and reactive to light.     Right eye: Pupil is round, reactive and not sluggish. No corneal abrasion or fluorescein uptake. Seidel exam negative.     Left eye: Pupil is round, reactive and not sluggish. No corneal abrasion or fluorescein uptake. Seidel exam negative.    Comments: Subconjunctival hemorrhage to left lateral eye. No fluorescein uptake, negative Seidel sign.  No dendritic lesions, abrasions or  ulcerations. No obvious foreign bodies PERRLA No hyphema  Cardiovascular:     Rate and Rhythm: Normal rate.  Pulmonary:     Effort: No respiratory distress.  Abdominal:     General: There is no distension.  Musculoskeletal:  General: Normal range of motion.     Cervical back: Normal range of motion.  Skin:    General: Skin is warm and dry.  Neurological:     General: No focal deficit present.     Mental Status: She is alert.  Psychiatric:        Mood and Affect: Mood normal.    ED Results / Procedures / Treatments   Labs (all labs ordered are listed, but only abnormal results are displayed) Labs Reviewed - No data to display  EKG None  Radiology No results found.  Procedures Procedures   Medications Ordered in ED Medications  tetracaine (PONTOCAINE) 0.5 % ophthalmic solution 1-2 drop (2 drops Left Eye Given 06/08/21 2049)  fluorescein ophthalmic strip 1 strip (1 strip Left Eye Given 06/08/21 2049)  erythromycin ophthalmic ointment ( Left Eye Given 06/08/21 2054)   ED Course  I have reviewed the triage vital signs and the nursing notes.  Pertinent labs & imaging results that were available during my care of the patient were reviewed by me and considered in my medical decision making (see chart for details).  Here for evaluation of irritated left eye which began yesterday.  Noted today some increased redness.  No visual field cuts, no change in vision.  She is currently followed by ophthalmology.  Does not wear contacts.  Noted some pruritus however denies any obvious foreign objects, trauma, chemical irritant exposures.  No hyphema's on exam.  PERRLA.  Patient with subconjunctival hemorrhage to left lateral eye.  No fluorescein uptake, negative Seidel sign. Quiet chamber.  Low suspicion for globe rupture, uveitis, iritis, abrasions, ulcerations, dendritic lesions.  No evidence of preseptal or orbital cellulitis.  No entrapment on exam  Patient will be given  erythromycin ophthalmic.  Personal hygiene and frequent handwashing discussed.  Patient advised to followup with ophthalmologist for reevaluation in several days..  Patient verbalizes understanding and is agreeable with discharge.  The patient has been appropriately medically screened and/or stabilized in the ED. I have low suspicion for any other emergent medical condition which would require further screening, evaluation or treatment in the ED or require inpatient management.  Patient is hemodynamically stable and in no acute distress.  Patient able to ambulate in department prior to ED.  Evaluation does not show acute pathology that would require ongoing or additional emergent interventions while in the emergency department or further inpatient treatment.  I have discussed the diagnosis with the patient and answered all questions.  Pain is been managed while in the emergency department and patient has no further complaints prior to discharge.  Patient is comfortable with plan discussed in room and is stable for discharge at this time.  I have discussed strict return precautions for returning to the emergency department.  Patient was encouraged to follow-up with PCP/specialist refer to at discharge.        MDM Rules/Calculators/A&P                            Final Clinical Impression(s) / ED Diagnoses Final diagnoses:  Eye irritation  Subconjunctival hemorrhage of left eye    Rx / DC Orders ED Discharge Orders          Ordered    erythromycin ophthalmic ointment        06/08/21 2053             Chayah Mckee A, PA-C 06/08/21 2100    Sloan Leiter, DO  06/08/21 2329  

## 2021-06-08 NOTE — Discharge Instructions (Addendum)
Follow-up with your ophthalmology  Use drops as prescribed  Return for new or worsening symptoms

## 2021-06-09 LAB — MEASLES/MUMPS/RUBELLA IMMUNITY
Mumps IgG: 211 AU/mL
Rubella: 15.6 Index
Rubeola IgG: >300 AU/mL

## 2021-06-10 ENCOUNTER — Telehealth: Payer: Self-pay | Admitting: Nurse Practitioner

## 2021-06-10 NOTE — Telephone Encounter (Signed)
Pt received something in the mail that said she was due for more injections.  She thought she was finished with all of her Hep A and B shots, but whatever she received states that she has had 1 of 2 of her Hep A and 1 of 3 of her Hep B.   Please advise.Marland KitchenMarland Kitchen

## 2021-06-23 NOTE — Telephone Encounter (Signed)
Pt called back asking why she has not heard anything back about this.

## 2021-07-01 NOTE — Telephone Encounter (Signed)
Pt states letter came from [facility] department and she will be reaching out to them to figure out things out.

## 2021-07-20 ENCOUNTER — Encounter: Payer: Self-pay | Admitting: Nurse Practitioner

## 2021-07-20 LAB — HM MAMMOGRAPHY

## 2021-07-26 ENCOUNTER — Encounter: Payer: BC Managed Care – PPO | Admitting: Obstetrics and Gynecology

## 2021-07-26 ENCOUNTER — Other Ambulatory Visit: Payer: Self-pay

## 2021-07-26 ENCOUNTER — Encounter: Payer: Self-pay | Admitting: Nurse Practitioner

## 2021-07-26 ENCOUNTER — Ambulatory Visit (INDEPENDENT_AMBULATORY_CARE_PROVIDER_SITE_OTHER): Payer: 59 | Admitting: Nurse Practitioner

## 2021-07-26 VITALS — BP 140/82 | Ht 62.5 in | Wt 162.0 lb

## 2021-07-26 DIAGNOSIS — Z8262 Family history of osteoporosis: Secondary | ICD-10-CM

## 2021-07-26 DIAGNOSIS — Z780 Asymptomatic menopausal state: Secondary | ICD-10-CM

## 2021-07-26 DIAGNOSIS — Z01419 Encounter for gynecological examination (general) (routine) without abnormal findings: Secondary | ICD-10-CM

## 2021-07-26 NOTE — Progress Notes (Signed)
° °  Tiffany Vargas 08/17/1955 [id number]   History:  65 y.o. G2P1011 presents for annual exam. Postmenopausal - no HRT, no bleeding. Normal pap history. History of recurrent UTIs, started taking UQORA and has only had 1 UTI this year. HTN, HLD managed by PCP.   Gynecologic History Patient's last menstrual period was 03/06/2014.   Contraception/Family planning: post menopausal status Sexually active: No  Health Maintenance Last Pap: 07/01/2019. Results were: Normal, 3-year repeat Last mammogram: 06/2021. Results were: Stable left breast calcifications since 2017 Last colonoscopy: 2009. Results were: Normal. Negative Cologuard in 2019 Last Dexa: 02/03/2016. Results were: Normal  Past medical history, past surgical history, family history and social history were all reviewed and documented in the EPIC chart. Widowed. Retired. 1 daughter lives in [place]. Originally from [place].   ROS:  A ROS was performed and pertinent positives and negatives are included.  Exam:  Vitals:   07/26/21 1125  BP: 140/82  Weight: 162 lb (73.5 kg)  Height: 5' 2.5" (1.588 m)   Body mass index is 29.16 kg/m.  General appearance:  Normal Thyroid:  Symmetrical, normal in size, without palpable masses or nodularity. Respiratory  Auscultation:  Clear without wheezing or rhonchi Cardiovascular  Auscultation:  Regular rate, without rubs, murmurs or gallops  Edema/varicosities:  Not grossly evident Abdominal  Soft,nontender, without masses, guarding or rebound.  Liver/spleen:  No organomegaly noted  Hernia:  None appreciated  Skin  Inspection:  Grossly normal Breasts: Examined lying and sitting.   Right: Without masses, retractions, nipple discharge or axillary adenopathy.   Left: Without masses, retractions, nipple discharge or axillary adenopathy. Genitourinary   Inguinal/mons:  Normal without inguinal adenopathy  External genitalia:  Normal appearing vulva with no masses, tenderness, or lesions.  Atrophic changes  BUS/Urethra/Skene's glands:  Normal  Vagina:  Normal appearing with normal color and discharge, no lesions. Atrophic changes  Cervix:  Normal appearing without discharge or lesions  Uterus:  Normal in size, shape and contour.  Midline and mobile, nontender  Adnexa/parametria:     Rt: Normal in size, without masses or tenderness.   Lt: Normal in size, without masses or tenderness.  Anus and perineum: Normal  Digital rectal exam: Normal sphincter tone without palpated masses or tenderness  Patient informed chaperone available to be present for breast and pelvic exam. Patient has requested no chaperone to be present. Patient has been advised what will be completed during breast and pelvic exam.   Assessment/Plan:  65 y.o. G2P1011 for annual exam.   Well female exam with routine gynecological exam - Education provided on SBEs, importance of preventative screenings, current guidelines, high calcium diet, regular exercise, and multivitamin daily.  Labs with PCP.   Postmenopausal - Plan: DG Bone Density. No HRT, no bleeding.   Family history of osteoporosis in mother - Plan: DG Bone Density. Normal bone density in 2017. Will schedule DXA now.   Screening for cervical cancer - Normal Pap history.  Will repeat at 3-year interval per guidelines.  Screening for breast cancer - Normal mammogram history.  Continue annual screenings.  Normal breast exam today.  Screening for colon cancer - 2009 colonoscopy. Negative Cologuard in 2019. PCP's office manages. She has requested twice but has not received yet. Will let us know if she needs assistance.   Return in 1 year for annual.   Olivia Mackie DNP, 11:56 AM 07/26/2021

## 2021-09-13 ENCOUNTER — Other Ambulatory Visit: Payer: Self-pay

## 2021-09-13 ENCOUNTER — Other Ambulatory Visit: Payer: Self-pay | Admitting: Nurse Practitioner

## 2021-09-13 ENCOUNTER — Ambulatory Visit (INDEPENDENT_AMBULATORY_CARE_PROVIDER_SITE_OTHER): Payer: 59

## 2021-09-13 DIAGNOSIS — Z13820 Encounter for screening for osteoporosis: Secondary | ICD-10-CM | POA: Diagnosis not present

## 2021-09-13 DIAGNOSIS — Z8262 Family history of osteoporosis: Secondary | ICD-10-CM

## 2021-09-13 DIAGNOSIS — Z780 Asymptomatic menopausal state: Secondary | ICD-10-CM | POA: Diagnosis not present

## 2021-10-21 ENCOUNTER — Encounter: Payer: Self-pay | Admitting: Nurse Practitioner

## 2021-10-21 ENCOUNTER — Other Ambulatory Visit: Payer: Self-pay

## 2021-10-21 DIAGNOSIS — Z1211 Encounter for screening for malignant neoplasm of colon: Secondary | ICD-10-CM

## 2021-11-10 LAB — COLOGUARD: COLOGUARD: NEGATIVE

## 2021-12-07 ENCOUNTER — Ambulatory Visit: Payer: 59 | Admitting: Nurse Practitioner

## 2022-05-28 ENCOUNTER — Other Ambulatory Visit: Payer: Self-pay | Admitting: Nurse Practitioner

## 2022-05-28 DIAGNOSIS — I10 Essential (primary) hypertension: Secondary | ICD-10-CM

## 2022-07-27 ENCOUNTER — Ambulatory Visit: Payer: 59 | Admitting: Nurse Practitioner

## 2023-12-07 ENCOUNTER — Telehealth: Payer: Self-pay | Admitting: Nurse Practitioner

## 2023-12-07 NOTE — Telephone Encounter (Signed)
 Lvmtcb if needing appt.
